# Patient Record
Sex: Male | Born: 1985 | Race: White | Hispanic: No | Marital: Married | Smoking: Former smoker
Health system: Southern US, Community
[De-identification: ages and names within clinical notes are randomized; demographics above are authoritative.]

## PROBLEM LIST (undated history)

## (undated) ENCOUNTER — Ambulatory Visit (HOSPITAL_COMMUNITY): Payer: No Typology Code available for payment source | Source: Home / Self Care

## (undated) ENCOUNTER — Ambulatory Visit: Payer: No Typology Code available for payment source

## (undated) DIAGNOSIS — J45909 Unspecified asthma, uncomplicated: Secondary | ICD-10-CM

## (undated) DIAGNOSIS — I10 Essential (primary) hypertension: Secondary | ICD-10-CM

## (undated) HISTORY — PX: APPENDECTOMY: SHX54

## (undated) HISTORY — PX: INNER EAR SURGERY: SHX679

---

## 2016-10-10 ENCOUNTER — Encounter: Payer: Self-pay | Admitting: Emergency Medicine

## 2016-10-10 ENCOUNTER — Emergency Department
Admission: EM | Admit: 2016-10-10 | Discharge: 2016-10-10 | Disposition: A | Discharge status: Home / Self Care | Payer: Medicaid - Out of State | Attending: Emergency Medicine | Admitting: Emergency Medicine

## 2016-10-10 DIAGNOSIS — J069 Acute upper respiratory infection, unspecified: Secondary | ICD-10-CM

## 2016-10-10 DIAGNOSIS — Y9301 Activity, walking, marching and hiking: Secondary | ICD-10-CM | POA: Diagnosis not present

## 2016-10-10 DIAGNOSIS — Y999 Unspecified external cause status: Secondary | ICD-10-CM | POA: Insufficient documentation

## 2016-10-10 DIAGNOSIS — S76211A Strain of adductor muscle, fascia and tendon of right thigh, initial encounter: Secondary | ICD-10-CM | POA: Diagnosis not present

## 2016-10-10 DIAGNOSIS — R0981 Nasal congestion: Secondary | ICD-10-CM | POA: Diagnosis present

## 2016-10-10 DIAGNOSIS — X509XXA Other and unspecified overexertion or strenuous movements or postures, initial encounter: Secondary | ICD-10-CM | POA: Insufficient documentation

## 2016-10-10 DIAGNOSIS — Y929 Unspecified place or not applicable: Secondary | ICD-10-CM | POA: Diagnosis not present

## 2016-10-10 MED ORDER — IBUPROFEN 600 MG PO TABS: 600.0000 mg | ORAL_TABLET | Freq: Three times a day (TID) | ORAL | 0 refills | Status: DC | PRN

## 2016-10-10 MED ORDER — PSEUDOEPH-BROMPHEN-DM 30-2-10 MG/5ML PO SYRP: 5.0000 mL | ORAL_SOLUTION | Freq: Four times a day (QID) | ORAL | 0 refills | Status: DC | PRN

## 2016-10-10 MED ORDER — TRAMADOL HCL 50 MG PO TABS: 50.0000 mg | ORAL_TABLET | Freq: Four times a day (QID) | ORAL | 0 refills | Status: DC | PRN

## 2016-10-10 NOTE — ED Notes (Signed)
See triage note  States he developed pain to right groin and leg on Saturday  Then some discomfort went up to chest and headache  Positive fever /chills but afebrile on arrival also has had dry cough ambulates well to treatment area.

## 2016-10-10 NOTE — ED Provider Notes (Signed)
[facility] Emergency Department Provider Note   ____________________________________________   First MD Initiated Contact with Patient 10/10/16 [phone]     (approximate)  I have reviewed the triage vital signs and the nursing notes.   HISTORY  Chief Complaint Flu like symptoms    HPI Julian Franklin is a 31 y.o. male patient complaining of right groin pain and URI signs or symptoms.Patient state right groin pain started after walking incident 2 days ago. Patient state URI signs and symptoms consisting of nasal and chest congestion started yesterday. Patient denies fever associated this complaint. Patient denies nausea, vomiting, or diarrhea. Patient described his groin pain as "achy". No palliative measures for complaint. Patient rates his pain as a 6/10.   History reviewed. No pertinent past medical history.  There are no active problems to display for this patient.   Past Surgical History:  Procedure Laterality Date  . APPENDECTOMY    . INNER EAR SURGERY      Prior to Admission medications   Medication Sig Start Date End Date Taking? Authorizing Provider  brompheniramine-pseudoephedrine-DM 30-2-10 MG/5ML syrup Take 5 mLs by mouth 4 (four) times daily as needed. 10/10/16   Joni ReiningSmith, Roselie Cirigliano K, PA-C  ibuprofen (ADVIL,MOTRIN) 600 MG tablet Take 1 tablet (600 mg total) by mouth every 8 (eight) hours as needed. 10/10/16   Joni ReiningSmith, Jaceion Aday K, PA-C  traMADol (ULTRAM) 50 MG tablet Take 1 tablet (50 mg total) by mouth every 6 (six) hours as needed for moderate pain. 10/10/16   Joni ReiningSmith, Mouna Yager K, PA-C    Allergies Ceclor [cefaclor]  No family history on file.  Social History Social History  Substance Use Topics  . Smoking status: Not on file  . Smokeless tobacco: Not on file  . Alcohol use Not on file    Review of Systems  Constitutional: No fever/chills Eyes: No visual changes. ENT: No sore throat. Cardiovascular: Denies chest pain. Respiratory:  Denies shortness of breath. Gastrointestinal: No abdominal pain.  No nausea, no vomiting.  No diarrhea.  No constipation. Genitourinary: Negative for dysuria. Musculoskeletal: Negative for back pain. Skin: Negative for rash. Neurological: Negative for headaches, focal weakness or numbness. Allergic/Immunilogical: See medication list ____________________________________________   PHYSICAL EXAM:  VITAL SIGNS: ED Triage Vitals  Enc Vitals Group     BP 10/10/16 0849 125/80     Pulse Rate 10/10/16 0849 87     Resp 10/10/16 0849 18     Temp 10/10/16 0849 98.5 F (36.9 C)     Temp Source 10/10/16 0849 Oral     SpO2 10/10/16 0849 99 %     Weight 10/10/16 0845 200 lb (90.7 kg)     Height 10/10/16 0845 5\' 5"  (1.651 m)     Head Circumference --      Peak Flow --      Pain Score 10/10/16 0843 6     Pain Loc --      Pain Edu? --      Excl. in GC? --     Constitutional: Alert and oriented. Well appearing and in no acute distress. Eyes: Conjunctivae are normal. PERRL. EOMI. Head: Atraumatic. Nose:Edematous nasal turbinates clear rhinorrhea. Bilateral maxillary guarding.  Mouth/Throat: Mucous membranes are moist.  Oropharynx non-erythematous. Postnasal drainage. Neck: No stridor.  Hematological/Lymphatic/Immunilogical: No cervical lymphadenopathy. Cardiovascular: Normal rate, regular rhythm. Grossly normal heart sounds.  Good peripheral circulation. Respiratory: Normal respiratory effort.  No retractions. Lungs CTAB. Gastrointestinal: Soft and nontender. No distention. No abdominal bruits. No CVA tenderness.  Musculoskeletal: No lower extremity tenderness nor edema.  No joint effusions. Moderate guarding with palpation the right inguinal area. No palpable masses. Neurologic:  Normal speech and language. No gross focal neurologic deficits are appreciated. No gait instability. Skin:  Skin is warm, dry and intact. No rash noted. Psychiatric: Mood and affect are normal. Speech and behavior are  normal.  ____________________________________________   LABS (all labs ordered are listed, but only abnormal results are displayed)  Labs Reviewed - No data to display ____________________________________________  EKG   ____________________________________________  RADIOLOGY  No results found.  ____________________________________________   PROCEDURES  Procedure(s) performed: None  Procedures  Critical Care performed: No  ____________________________________________   INITIAL IMPRESSION / ASSESSMENT AND PLAN / ED COURSE  Pertinent labs & imaging results that were available during my care of the patient were reviewed by me and considered in my medical decision making (see chart for details).  URI with right inguinal strain. Patient given discharge care instructions.      ____________________________________________   FINAL CLINICAL IMPRESSION(S) / ED DIAGNOSES  Final diagnoses:  Viral upper respiratory tract infection  Inguinal strain, right, initial encounter      NEW MEDICATIONS STARTED DURING THIS VISIT:  New Prescriptions   BROMPHENIRAMINE-PSEUDOEPHEDRINE-DM 30-2-10 MG/5ML SYRUP    Take 5 mLs by mouth 4 (four) times daily as needed.   IBUPROFEN (ADVIL,MOTRIN) 600 MG TABLET    Take 1 tablet (600 mg total) by mouth every 8 (eight) hours as needed.   TRAMADOL (ULTRAM) 50 MG TABLET    Take 1 tablet (50 mg total) by mouth every 6 (six) hours as needed for moderate pain.     Note:  This document was prepared using Dragon voice recognition software and may include unintentional dictation errors.    Joni Reining, PA-C 10/10/16 4098    Nita Sickle, MD 10/11/16 [phone]

## 2016-10-10 NOTE — ED Triage Notes (Signed)
Pt reports body aches, chills and nasal drainage. Denies cough. Denies vomiting or diarrhea. Pt reports symptoms began with leg pain and progressed. Pt reports symptoms began yesterday.

## 2016-10-11 ENCOUNTER — Encounter: Payer: Self-pay | Admitting: Emergency Medicine

## 2016-10-11 ENCOUNTER — Emergency Department
Admission: EM | Admit: 2016-10-11 | Discharge: 2016-10-11 | Disposition: A | Discharge status: Home / Self Care | Payer: Medicaid - Out of State | Attending: Emergency Medicine | Admitting: Emergency Medicine

## 2016-10-11 DIAGNOSIS — R21 Rash and other nonspecific skin eruption: Secondary | ICD-10-CM | POA: Insufficient documentation

## 2016-10-11 DIAGNOSIS — L259 Unspecified contact dermatitis, unspecified cause: Secondary | ICD-10-CM | POA: Diagnosis not present

## 2016-10-11 DIAGNOSIS — F17200 Nicotine dependence, unspecified, uncomplicated: Secondary | ICD-10-CM | POA: Diagnosis not present

## 2016-10-11 DIAGNOSIS — L309 Dermatitis, unspecified: Secondary | ICD-10-CM

## 2016-10-11 DIAGNOSIS — L299 Pruritus, unspecified: Secondary | ICD-10-CM | POA: Diagnosis present

## 2016-10-11 MED ORDER — HYDROXYZINE HCL 50 MG PO TABS
50.0000 mg | ORAL_TABLET | Freq: Once | ORAL | Status: AC
  Administered 2016-10-11: 50 mg via ORAL
  Filled 2016-10-11: qty 1

## 2016-10-11 MED ORDER — METHYLPREDNISOLONE 4 MG PO TBPK: ORAL_TABLET | ORAL | 0 refills | Status: DC

## 2016-10-11 MED ORDER — METHYLPREDNISOLONE SODIUM SUCC 125 MG IJ SOLR
125.0000 mg | Freq: Once | INTRAMUSCULAR | Status: AC
  Administered 2016-10-11: 125 mg via INTRAMUSCULAR
  Filled 2016-10-11: qty 2

## 2016-10-11 MED ORDER — HYDROXYZINE HCL 50 MG PO TABS: 50.0000 mg | ORAL_TABLET | Freq: Three times a day (TID) | ORAL | 0 refills | Status: DC | PRN

## 2016-10-11 NOTE — ED Notes (Signed)
See triage note states he was seen last week for right groin and upper leg pain    Then developed a red rash to right lower leg with some swelling

## 2016-10-11 NOTE — ED Triage Notes (Signed)
Pt to ed with c/o right leg rash.  Redness and mild swelling noted to right lower leg that started yesterday.

## 2016-10-11 NOTE — ED Provider Notes (Signed)
[facility] Emergency Department Provider Note   ____________________________________________   First MD Initiated Contact with Patient 10/11/16 [phone]     (approximate)  I have reviewed the triage vital signs and the nursing notes.   HISTORY  Chief Complaint Leg Pain    HPI Mancel ParsonsJonathan David Brisbane is a 31 y.o. male patient complaining of rash to right upper and lower leg which began yesterday. Patient stated mild redness ,swelling, and intense itching. Patient unsure etiology a rash. No palliative measures for complaint. Patient rates his pain as a 6/10 but he stated this secondary to inguinal strain.   History reviewed. No pertinent past medical history.  There are no active problems to display for this patient.   Past Surgical History:  Procedure Laterality Date  . APPENDECTOMY    . INNER EAR SURGERY      Prior to Admission medications   Medication Sig Start Date End Date Taking? Authorizing Provider  brompheniramine-pseudoephedrine-DM 30-2-10 MG/5ML syrup Take 5 mLs by mouth 4 (four) times daily as needed. 10/10/16   Joni ReiningSmith, Cardale Dorer K, PA-C  hydrOXYzine (ATARAX/VISTARIL) 50 MG tablet Take 1 tablet (50 mg total) by mouth 3 (three) times daily as needed for itching. 10/11/16   Joni ReiningSmith, Bianca Raneri K, PA-C  ibuprofen (ADVIL,MOTRIN) 600 MG tablet Take 1 tablet (600 mg total) by mouth every 8 (eight) hours as needed. 10/10/16   Joni ReiningSmith, Ammaar Encina K, PA-C  methylPREDNISolone (MEDROL DOSEPAK) 4 MG TBPK tablet Take Tapered dose as directed 10/11/16   Joni ReiningSmith, Resa Rinks K, PA-C  traMADol (ULTRAM) 50 MG tablet Take 1 tablet (50 mg total) by mouth every 6 (six) hours as needed for moderate pain. 10/10/16   Joni ReiningSmith, Demi Trieu K, PA-C    Allergies   History reviewed. No pertinent family history.  Social History Social History  Substance Use Topics  . Smoking status: Current Every Day Smoker  . Smokeless tobacco: Never Used  . Alcohol use No    Review of  Systems  Constitutional: No fever/chills Eyes: No visual changes. ENT: No sore throat. Cardiovascular: Denies chest pain. Respiratory: Denies shortness of breath. Gastrointestinal: No abdominal pain.  No nausea, no vomiting.  No diarrhea.  No constipation. Genitourinary: Negative for dysuria. Musculoskeletal: Negative for back pain. Skin:Positive for rash. Neurological: Negative for headaches, focal weakness or numbness.   ____________________________________________   PHYSICAL EXAM:  VITAL SIGNS: ED Triage Vitals [10/11/16 0816]  Enc Vitals Group     BP (!) 132/91     Pulse Rate (!) 108     Resp 16     Temp 98.7 F (37.1 C)     Temp Source Oral     SpO2 100 %     Weight      Height      Head Circumference      Peak Flow      Pain Score 6     Pain Loc      Pain Edu?      Excl. in GC?     Constitutional: Alert and oriented. Well appearing and in no acute distress. Eyes: Conjunctivae are normal. PERRL. EOMI. Cardiovascular: Normal rate, regular rhythm. Grossly normal heart sounds.  Good peripheral circulation. Respiratory: Normal respiratory effort.  No retractions. Lungs CTAB. Neurologic:  Normal speech and language. No gross focal neurologic deficits are appreciated. No gait instability. Skin:  Skin is warm, dry and intact. Vesicle lesions on erythematous base right leg.  Psychiatric: Mood and affect are normal. Speech and behavior are normal.  ____________________________________________   LABS (all labs ordered are listed, but only abnormal results are displayed)  Labs Reviewed - No data to display ____________________________________________  EKG   ____________________________________________  RADIOLOGY  No results found.  ____________________________________________   PROCEDURES  Procedure(s) performed: None  Procedures  Critical Care performed: No  ____________________________________________   INITIAL IMPRESSION / ASSESSMENT AND PLAN  / ED COURSE  Pertinent labs & imaging results that were available during my care of the patient were reviewed by me and considered in my medical decision making (see chart for details).  Contact dermatitis etiology unknown. Discussed treatment therapy with patient. Patient given discharge care instructions. Patient advised [facility] if no improvement in 3-5 days.      ____________________________________________   FINAL CLINICAL IMPRESSION(S) / ED DIAGNOSES  Final diagnoses:  Dermatitis  Rash and nonspecific skin eruption      NEW MEDICATIONS STARTED DURING THIS VISIT:  New Prescriptions   HYDROXYZINE (ATARAX/VISTARIL) 50 MG TABLET    Take 1 tablet (50 mg total) by mouth 3 (three) times daily as needed for itching.   METHYLPREDNISOLONE (MEDROL DOSEPAK) 4 MG TBPK TABLET    Take Tapered dose as directed     Note:  This document was prepared using Dragon voice recognition software and may include unintentional dictation errors.    Joni Reining, PA-C 10/11/16 0850    Rockne Menghini, MD 10/11/16 [phone]

## 2016-10-14 ENCOUNTER — Emergency Department
Admission: EM | Admit: 2016-10-14 | Discharge: 2016-10-14 | Disposition: A | Discharge status: Home / Self Care | Payer: Medicaid - Out of State | Attending: Emergency Medicine | Admitting: Emergency Medicine

## 2016-10-14 ENCOUNTER — Encounter: Payer: Self-pay | Admitting: *Deleted

## 2016-10-14 ENCOUNTER — Emergency Department: Payer: Medicaid - Out of State

## 2016-10-14 DIAGNOSIS — R2241 Localized swelling, mass and lump, right lower limb: Secondary | ICD-10-CM | POA: Diagnosis present

## 2016-10-14 DIAGNOSIS — F17200 Nicotine dependence, unspecified, uncomplicated: Secondary | ICD-10-CM | POA: Insufficient documentation

## 2016-10-14 DIAGNOSIS — L03115 Cellulitis of right lower limb: Secondary | ICD-10-CM | POA: Insufficient documentation

## 2016-10-14 LAB — CBC WITH DIFFERENTIAL/PLATELET
BASOS ABS: 0.2 10*3/uL — AB (ref 0–0.1)
BASOS PCT: 1 %
Eosinophils Absolute: 0.1 10*3/uL (ref 0–0.7)
Eosinophils Relative: 0 %
HEMATOCRIT: 38.3 % — AB (ref 40.0–52.0)
Hemoglobin: 13 g/dL (ref 13.0–18.0)
LYMPHS PCT: 18 %
Lymphs Abs: 4.1 10*3/uL — ABNORMAL HIGH (ref 1.0–3.6)
MCH: 27.2 pg (ref 26.0–34.0)
MCHC: 34 g/dL (ref 32.0–36.0)
MCV: 80 fL (ref 80.0–100.0)
Monocytes Absolute: 1.5 10*3/uL — ABNORMAL HIGH (ref 0.2–1.0)
Monocytes Relative: 7 %
NEUTROS ABS: 16.6 10*3/uL — AB (ref 1.4–6.5)
Neutrophils Relative %: 74 %
PLATELETS: 308 10*3/uL (ref 150–440)
RBC: 4.79 MIL/uL (ref 4.40–5.90)
RDW: 13.9 % (ref 11.5–14.5)
WBC: 22.4 10*3/uL — AB (ref 3.8–10.6)

## 2016-10-14 LAB — COMPREHENSIVE METABOLIC PANEL
ALBUMIN: 4 g/dL (ref 3.5–5.0)
ALK PHOS: 77 U/L (ref 38–126)
ALT: 28 U/L (ref 17–63)
ANION GAP: 5 (ref 5–15)
AST: 27 U/L (ref 15–41)
BILIRUBIN TOTAL: 0.5 mg/dL (ref 0.3–1.2)
BUN: 13 mg/dL (ref 6–20)
CALCIUM: 8.5 mg/dL — AB (ref 8.9–10.3)
CO2: 24 mmol/L (ref 22–32)
Chloride: 106 mmol/L (ref 101–111)
Creatinine, Ser: 0.82 mg/dL (ref 0.61–1.24)
Glucose, Bld: 126 mg/dL — ABNORMAL HIGH (ref 65–99)
POTASSIUM: 3.5 mmol/L (ref 3.5–5.1)
Sodium: 135 mmol/L (ref 135–145)
TOTAL PROTEIN: 7.4 g/dL (ref 6.5–8.1)

## 2016-10-14 MED ORDER — DOXYCYCLINE HYCLATE 100 MG PO TABS
100.0000 mg | ORAL_TABLET | Freq: Once | ORAL | Status: AC
  Administered 2016-10-14: 100 mg via ORAL
  Filled 2016-10-14: qty 1

## 2016-10-14 MED ORDER — DOXYCYCLINE HYCLATE 100 MG PO CAPS: 100.0000 mg | ORAL_CAPSULE | Freq: Two times a day (BID) | ORAL | 0 refills | Status: DC

## 2016-10-14 NOTE — ED Triage Notes (Signed)
States he was seen in ED Monday and Tuesday for right leg pain, states since Tuesday he has developed increased redness and pain in his right leg, large red area to inner right shin that goes up his leg, ambulatory

## 2016-10-14 NOTE — ED Notes (Signed)
Pt in US at this time 

## 2016-10-14 NOTE — ED Notes (Signed)
Pt returned from US

## 2016-10-14 NOTE — ED Provider Notes (Signed)
[facility] Emergency Department Provider Note  ____________________________________________   First MD Initiated Contact with Patient 10/14/16 2023     (approximate)  I have reviewed the triage vital signs and the nursing notes.   HISTORY  Chief Complaint Leg Pain and Rash   HPI Julian Franklin is a 31 y.o. male without any chronic medical conditions was presenting to the emergency department today with 1 week of right lower extremity rash and now swelling. He initially presented on July 16 to this emergency department after having URI symptoms with right groin pain. He says that the right groin pain has now subsided but that he has had an expanding rash to his right lower extremity especially to the medial aspect. He returned to the emergency department on the 17th and was diagnosed with a contact dermatitis because he reported that the rash was itchy. At that point he was prescribed hydroxyzine as well as a Medrol Dosepak. However, since taking these medications as symptoms have progressed. He says that the swelling started yesterday and has affected with his foot and his right lower extremity. Denies any history of blood clots does say that he smokes. Says that in addition to the itching and is also painful.   History reviewed. No pertinent past medical history.  There are no active problems to display for this patient.   Past Surgical History:  Procedure Laterality Date  . APPENDECTOMY    . INNER EAR SURGERY      Prior to Admission medications   Medication Sig Start Date End Date Taking? Authorizing Provider  brompheniramine-pseudoephedrine-DM 30-2-10 MG/5ML syrup Take 5 mLs by mouth 4 (four) times daily as needed. 10/10/16  Yes Joni Reining, PA-C  hydrOXYzine (ATARAX/VISTARIL) 50 MG tablet Take 1 tablet (50 mg total) by mouth 3 (three) times daily as needed for itching. 10/11/16  Yes Joni Reining, PA-C  ibuprofen (ADVIL,MOTRIN) 600 MG  tablet Take 1 tablet (600 mg total) by mouth every 8 (eight) hours as needed. 10/10/16  Yes Joni Reining, PA-C  methylPREDNISolone (MEDROL DOSEPAK) 4 MG TBPK tablet Take Tapered dose as directed 10/11/16  Yes Joni Reining, PA-C  traMADol (ULTRAM) 50 MG tablet Take 1 tablet (50 mg total) by mouth every 6 (six) hours as needed for moderate pain. 10/10/16  Yes Joni Reining, PA-C    Allergies Ceclor [cefaclor]  History reviewed. No pertinent family history.  Social History Social History  Substance Use Topics  . Smoking status: Current Every Day Smoker  . Smokeless tobacco: Never Used  . Alcohol use No    Review of Systems  Constitutional: chills Eyes: No visual changes. ENT: No sore throat. Cardiovascular: Denies chest pain. Respiratory: Denies shortness of breath. Gastrointestinal: No abdominal pain.  No nausea, no vomiting.  No diarrhea.  No constipation. Genitourinary: Negative for dysuria. Musculoskeletal: Negative for back pain. Skin: Negative for rash. Neurological: Negative for headaches, focal weakness or numbness.   ____________________________________________   PHYSICAL EXAM:  VITAL SIGNS: ED Triage Vitals  Enc Vitals Group     BP 10/14/16 1641 135/86     Pulse Rate 10/14/16 1641 80     Resp 10/14/16 1641 16     Temp 10/14/16 1641 99.1 F (37.3 C)     Temp Source 10/14/16 1641 Oral     SpO2 10/14/16 1641 97 %     Weight --      Height --      Head Circumference --  Peak Flow --      Pain Score 10/14/16 1636 4     Pain Loc --      Pain Edu? --      Excl. in GC? --     Constitutional: Alert and oriented. Well appearing and in no acute distress. Eyes: Conjunctivae are normal.  Head: Atraumatic. Nose: No congestion/rhinnorhea. Mouth/Throat: Mucous membranes are moist.  Neck: No stridor.   Cardiovascular: Normal rate, regular rhythm. Grossly normal heart sounds.  Good peripheral circulation. Respiratory: Normal respiratory effort.  No  retractions. Lungs CTAB. Gastrointestinal: Soft and nontender. No distention. No CVA tenderness. Musculoskeletal: Right leg extremity with erythema laterally as well as medially but mostly sparing the posterior aspect of the calf. Medially there is a patch of erythema that is not warm to touch that is approximately 10 cm and circular. It is tender to palpation without any induration. Laterally there is erythema which is also non-blanchable and without any induration or warmth. This patch covers the majority of the lateral aspect of the calf. There is also edema to the right lower extremity from the foot extending just distal to the right knee. Dorsalis pedis pulses are present and equal bilaterally. Neurologic:  Normal speech and language. No gross focal neurologic deficits are appreciated. Skin:  Skin is warm, dry and intact. No rash noted. Psychiatric: Mood and affect are normal. Speech and behavior are normal.  ____________________________________________   LABS (all labs ordered are listed, but only abnormal results are displayed)  Labs Reviewed  COMPREHENSIVE METABOLIC PANEL - Abnormal; Notable for the following:       Result Value   Glucose, Bld 126 (*)    Calcium 8.5 (*)    All other components within normal limits  CBC WITH DIFFERENTIAL/PLATELET - Abnormal; Notable for the following:    WBC 22.4 (*)    HCT 38.3 (*)    Neutro Abs 16.6 (*)    Lymphs Abs 4.1 (*)    Monocytes Absolute 1.5 (*)    Basophils Absolute 0.2 (*)    All other components within normal limits   ____________________________________________  EKG   ____________________________________________  RADIOLOGY  No evidence of DVT however there were internal nodes noted. ____________________________________________   PROCEDURES  Procedure(s) performed:   Procedures  Critical Care performed:   ____________________________________________   INITIAL IMPRESSION / ASSESSMENT AND PLAN / ED  COURSE  Pertinent labs & imaging results that were available during my care of the patient were reviewed by me and considered in my medical decision making (see chart for details).  ----------------------------------------- 11:12 PM on 10/14/2016 -----------------------------------------  Patient likely with cellulitis. Increased white blood cell count as well as reactive lymph nodes in the groin. Patient would like to be discharged home. I offered him IV antibiotics as well as observation but he says that he would rather go home and go to work. He is nontoxic in appearance with reassuring vital signs. We had a strict return precautions discussion that he would come back immediately for any worsening or concerning symptoms including fever or any worsening of the rash or swelling or pain. He is understanding of the plan and willing to comply. I reexamined his leg and there is no further spread of the rash. No worsening of the edema. The pain is not out of proportion to the exam. There is no crepitus. Patient denies any recent exposure to brackish or salt water.      ____________________________________________   FINAL CLINICAL IMPRESSION(S) / ED DIAGNOSES  Lower extremity cellulitis.    NEW MEDICATIONS STARTED DURING THIS VISIT:  New Prescriptions   No medications on file     Note:  This document was prepared using Dragon voice recognition software and may include unintentional dictation errors.     Myrna BlazerSchaevitz, Dorcus Riga Matthew, MD 10/14/16 [phone]

## 2017-01-02 ENCOUNTER — Encounter: Payer: Self-pay | Admitting: Emergency Medicine

## 2017-01-02 DIAGNOSIS — L03012 Cellulitis of left finger: Secondary | ICD-10-CM | POA: Insufficient documentation

## 2017-01-02 DIAGNOSIS — Y939 Activity, unspecified: Secondary | ICD-10-CM | POA: Insufficient documentation

## 2017-01-02 DIAGNOSIS — Z79899 Other long term (current) drug therapy: Secondary | ICD-10-CM | POA: Insufficient documentation

## 2017-01-02 DIAGNOSIS — F17200 Nicotine dependence, unspecified, uncomplicated: Secondary | ICD-10-CM | POA: Insufficient documentation

## 2017-01-02 DIAGNOSIS — Y929 Unspecified place or not applicable: Secondary | ICD-10-CM | POA: Insufficient documentation

## 2017-01-02 DIAGNOSIS — Y999 Unspecified external cause status: Secondary | ICD-10-CM | POA: Insufficient documentation

## 2017-01-02 DIAGNOSIS — W57XXXA Bitten or stung by nonvenomous insect and other nonvenomous arthropods, initial encounter: Secondary | ICD-10-CM | POA: Insufficient documentation

## 2017-01-02 NOTE — ED Triage Notes (Signed)
Patient ambulatory to triage with steady gait, without difficulty or distress noted; pt reports ?spider bite to left 3rd finger

## 2017-01-03 ENCOUNTER — Encounter: Payer: Self-pay | Admitting: Physician Assistant

## 2017-01-03 ENCOUNTER — Emergency Department
Admission: EM | Admit: 2017-01-03 | Discharge: 2017-01-03 | Disposition: A | Discharge status: Home / Self Care | Payer: Medicaid - Out of State | Attending: Emergency Medicine | Admitting: Emergency Medicine

## 2017-01-03 DIAGNOSIS — L03012 Cellulitis of left finger: Secondary | ICD-10-CM

## 2017-01-03 DIAGNOSIS — W57XXXA Bitten or stung by nonvenomous insect and other nonvenomous arthropods, initial encounter: Secondary | ICD-10-CM

## 2017-01-03 MED ORDER — BACITRACIN ZINC 500 UNIT/GM EX OINT
TOPICAL_OINTMENT | Freq: Once | CUTANEOUS | Status: AC
  Administered 2017-01-03: 02:00:00 via TOPICAL
  Filled 2017-01-03: qty 0.9

## 2017-01-03 MED ORDER — SULFAMETHOXAZOLE-TRIMETHOPRIM 800-160 MG PO TABS: 1.0000 | ORAL_TABLET | Freq: Two times a day (BID) | ORAL | 0 refills | Status: DC

## 2017-01-03 MED ORDER — SULFAMETHOXAZOLE-TRIMETHOPRIM 800-160 MG PO TABS
1.0000 | ORAL_TABLET | Freq: Once | ORAL | Status: AC
  Administered 2017-01-03: 1 via ORAL
  Filled 2017-01-03: qty 1

## 2017-01-03 NOTE — ED Provider Notes (Signed)
[facility] Emergency Department Provider Note ____________________________________________  Time seen: 0039  I have reviewed the triage vital signs and the nursing notes.  HISTORY  Chief Complaint  Insect Bite  HPI Julian Franklin is a 31 y.o. male presents to the ED for evaluation and management of a presumed spider bite. He noted tenderness and redness to the left middle finger while at work on Saturday. He is unclear if an insect bite occurred, but notes redness, tenderness, and a single pointing lesion. He denies fevers, chills, swelling or spontaneous drainage.  History reviewed. No pertinent past medical history.  There are no active problems to display for this patient.   Past Surgical History:  Procedure Laterality Date  . APPENDECTOMY    . INNER EAR SURGERY      Prior to Admission medications   Medication Sig Start Date End Date Taking? Authorizing Provider  brompheniramine-pseudoephedrine-DM 30-2-10 MG/5ML syrup Take 5 mLs by mouth 4 (four) times daily as needed. 10/10/16   Joni Reining, PA-C  doxycycline (VIBRAMYCIN) 100 MG capsule Take 1 capsule (100 mg total) by mouth 2 (two) times daily. 10/14/16   Myrna Blazer, MD  hydrOXYzine (ATARAX/VISTARIL) 50 MG tablet Take 1 tablet (50 mg total) by mouth 3 (three) times daily as needed for itching. 10/11/16   Joni Reining, PA-C  ibuprofen (ADVIL,MOTRIN) 600 MG tablet Take 1 tablet (600 mg total) by mouth every 8 (eight) hours as needed. 10/10/16   Joni Reining, PA-C  methylPREDNISolone (MEDROL DOSEPAK) 4 MG TBPK tablet Take Tapered dose as directed 10/11/16   Joni Reining, PA-C  sulfamethoxazole-trimethoprim (BACTRIM DS,SEPTRA DS) 800-160 MG tablet Take 1 tablet by mouth 2 (two) times daily. 01/03/17   Arhan Mcmanamon, Charlesetta Ivory, PA-C  traMADol (ULTRAM) 50 MG tablet Take 1 tablet (50 mg total) by mouth every 6 (six) hours as needed for moderate pain. 10/10/16   Joni Reining, PA-C    Allergies Ceclor [cefaclor]  No family history on file.  Social History Social History  Substance Use Topics  . Smoking status: Current Every Day Smoker  . Smokeless tobacco: Never Used  . Alcohol use No    Review of Systems  Constitutional: Negative for fever. Cardiovascular: Negative for chest pain. Respiratory: Negative for shortness of breath. Musculoskeletal: Negative for back pain. Skin: Negative for rash. Finger infection as above.  Neurological: Negative for headaches, focal weakness or numbness. ____________________________________________  PHYSICAL EXAM:  VITAL SIGNS: ED Triage Vitals [01/02/17 2305]  Enc Vitals Group     BP 139/84     Pulse Rate 83     Resp 18     Temp 98.8 F (37.1 C)     Temp src      SpO2 98 %     Weight 210 lb (95.3 kg)     Height  (1.651 m)     Head Circumference      Peak Flow      Pain Score 7     Pain Loc      Pain Edu?      Excl. in GC?     Constitutional: Alert and oriented. Well appearing and in no distress. Head: Normocephalic and atraumatic. Hematological/Lymphatic/Immunological: No cervical lymphadenopathy. Cardiovascular: Normal rate, regular rhythm. Normal distal pulses. Respiratory: Normal respiratory effort.  Musculoskeletal: Normal composite fist. Left ring finger with a single, 1 cm diameter, well-demarcated, erythematous macule with central pustule. Nontender with normal range of motion in all extremities.  Neurologic:  Normal gait without ataxia. Normal speech and language. No gross focal neurologic deficits are appreciated. Skin:  Skin is warm, dry and intact. No rash noted. ____________________________________________  PROCEDURES  Bactrim DS 1 PO  INCISION AND DRAINAGE Performed by: Lissa Hoard Consent: Verbal consent obtained. Risks and benefits: risks, benefits and alternatives were discussed Type: abscess  Body area: left middle finger  Anesthesia: none  Incision was made  with a 18 g needle.  Complexity: simple   Blunt dissection to break up loculations  Drainage: serosanguinous   Drainage amount: minimal  Dressing material: dry sterile dressing  Patient tolerance: Patient tolerated the procedure well with no immediate complications. ____________________________________________  INITIAL IMPRESSION / ASSESSMENT AND PLAN / ED COURSE  Patient with ED evaluation of a left middle finger cellulitis. The superficial lesion is I&D'd in the ED with minimal serosanguinous drainage. The wound is appropriately dressed, and a prescription for Bactrim is provided. Wound care instructions are provided. Return to a local community clinic or the ED as needed for wound check ____________________________________________  FINAL CLINICAL IMPRESSION(S) / ED DIAGNOSES  Final diagnoses:  Insect bite, initial encounter  Cellulitis of finger of left hand     Karmen Stabs, Charlesetta Ivory, PA-C 01/03/17 0102    Jene Every, MD 01/03/17 [phone]

## 2017-01-03 NOTE — ED Notes (Signed)
Pt assessed by Ronald Lobo, PA in triage; wound clensed and dressed by PA

## 2017-01-03 NOTE — Discharge Instructions (Signed)
Take the antibiotic as directed. Keep the wound clean, dry, and covered. Soak the wound in warm water + Epsom salts to promote healing. Follow-up with [facility] or return to the ED as needed.

## 2017-01-11 ENCOUNTER — Emergency Department
Admission: EM | Admit: 2017-01-11 | Discharge: 2017-01-11 | Disposition: A | Discharge status: Home / Self Care | Payer: Self-pay | Attending: Emergency Medicine | Admitting: Emergency Medicine

## 2017-01-11 ENCOUNTER — Encounter: Payer: Self-pay | Admitting: Emergency Medicine

## 2017-01-11 DIAGNOSIS — S60562D Insect bite (nonvenomous) of left hand, subsequent encounter: Secondary | ICD-10-CM | POA: Insufficient documentation

## 2017-01-11 DIAGNOSIS — F17200 Nicotine dependence, unspecified, uncomplicated: Secondary | ICD-10-CM | POA: Insufficient documentation

## 2017-01-11 DIAGNOSIS — W57XXXD Bitten or stung by nonvenomous insect and other nonvenomous arthropods, subsequent encounter: Secondary | ICD-10-CM | POA: Insufficient documentation

## 2017-01-11 MED ORDER — CLINDAMYCIN HCL 150 MG PO CAPS: 150.0000 mg | ORAL_CAPSULE | Freq: Four times a day (QID) | ORAL | 0 refills | Status: DC

## 2017-01-11 MED ORDER — IBUPROFEN 600 MG PO TABS: 600.0000 mg | ORAL_TABLET | Freq: Three times a day (TID) | ORAL | 0 refills | Status: DC | PRN

## 2017-01-11 MED ORDER — TRAMADOL HCL 50 MG PO TABS
50.0000 mg | ORAL_TABLET | Freq: Two times a day (BID) | ORAL | 0 refills | Status: DC | PRN
Start: 2017-01-11 — End: 2017-06-20

## 2017-01-11 NOTE — ED Triage Notes (Signed)
Possible spider bite to left hand.

## 2017-01-11 NOTE — Discharge Instructions (Signed)
Advised Epsom salts soak twice a day. 5-10 minutes.

## 2017-01-11 NOTE — ED Provider Notes (Signed)
[facility] Emergency Department Provider Note   ____________________________________________   First MD Initiated Contact with Patient 01/11/17 [phone]     (approximate)  I have reviewed the triage vital signs and the nursing notes.   HISTORY  Chief Complaint Insect Bite    HPI Julian Franklin is a 31 y.o. male patient complaining of edema and erythema to third digit left hand.atient seen 01/03/2017 at this facility noticed a little complaint. Patient was given Bactrim DS which has not resolved his complaint. Patient state increased edema and erythema since his last visit. Patient rates pain as a 5/10. No other palliative measures for complaint.  History reviewed. No pertinent past medical history.  There are no active problems to display for this patient.   Past Surgical History:  Procedure Laterality Date  . APPENDECTOMY    . INNER EAR SURGERY      Prior to Admission medications   Medication Sig Start Date End Date Taking? Authorizing Provider  brompheniramine-pseudoephedrine-DM 30-2-10 MG/5ML syrup Take 5 mLs by mouth 4 (four) times daily as needed. 10/10/16   Joni ReiningSmith, Keir Foland K, PA-C  clindamycin (CLEOCIN) 150 MG capsule Take 1 capsule (150 mg total) by mouth 4 (four) times daily. 01/11/17   Joni ReiningSmith, Salene Mohamud K, PA-C  doxycycline (VIBRAMYCIN) 100 MG capsule Take 1 capsule (100 mg total) by mouth 2 (two) times daily. 10/14/16   Myrna BlazerSchaevitz, David Matthew, MD  hydrOXYzine (ATARAX/VISTARIL) 50 MG tablet Take 1 tablet (50 mg total) by mouth 3 (three) times daily as needed for itching. 10/11/16   Joni ReiningSmith, Trask Vosler K, PA-C  ibuprofen (ADVIL,MOTRIN) 600 MG tablet Take 1 tablet (600 mg total) by mouth every 8 (eight) hours as needed. 10/10/16   Joni ReiningSmith, Athziri Freundlich K, PA-C  ibuprofen (ADVIL,MOTRIN) 600 MG tablet Take 1 tablet (600 mg total) by mouth every 8 (eight) hours as needed. 01/11/17   Joni ReiningSmith, Kasch Borquez K, PA-C  methylPREDNISolone (MEDROL DOSEPAK) 4 MG TBPK tablet Take  Tapered dose as directed 10/11/16   Joni ReiningSmith, Jalayna Josten K, PA-C  sulfamethoxazole-trimethoprim (BACTRIM DS,SEPTRA DS) 800-160 MG tablet Take 1 tablet by mouth 2 (two) times daily. 01/03/17   Menshew, Charlesetta IvoryJenise V Bacon, PA-C  traMADol (ULTRAM) 50 MG tablet Take 1 tablet (50 mg total) by mouth every 6 (six) hours as needed for moderate pain. 10/10/16   Joni ReiningSmith, Shilah Hefel K, PA-C  traMADol (ULTRAM) 50 MG tablet Take 1 tablet (50 mg total) by mouth every 12 (twelve) hours as needed. 01/11/17   Joni ReiningSmith, Elian Gloster K, PA-C    Allergies Ceclor [cefaclor]  No family history on file.  Social History Social History  Substance Use Topics  . Smoking status: Current Every Day Smoker  . Smokeless tobacco: Never Used  . Alcohol use No    Review of Systems Constitutional: No fever/chills Eyes: No visual changes. ENT: No sore throat. Cardiovascular: Denies chest pain. Respiratory: Denies shortness of breath. Gastrointestinal: No abdominal pain.  No nausea, no vomiting.  No diarrhea.  No constipation. Genitourinary: Negative for dysuria. Musculoskeletal: Negative for back pain. Skin: Negative for rash. Redness swelling third digit left hand  Neurological: Negative for headaches, focal weakness or numbness. Allergic/Immunilogical: Ceclor ____________________________________________   PHYSICAL EXAM:  VITAL SIGNS: ED Triage Vitals  Enc Vitals Group     BP 01/11/17 0835 121/85     Pulse Rate 01/11/17 0835 68     Resp 01/11/17 0835 18     Temp 01/11/17 0835 98.5 F (36.9 C)     Temp Source 01/11/17 0835 Oral  SpO2 01/11/17 0835 98 %     Weight 01/11/17 0832 210 lb (95.3 kg)     Height --      Head Circumference --      Peak Flow --      Pain Score 01/11/17 0832 5     Pain Loc --      Pain Edu? --      Excl. in GC? --     Constitutional: Alert and oriented. Well appearing and in no acute distress. Cardiovascular: Normal rate, regular rhythm. Grossly normal heart sounds.  Good peripheral  circulation. Respiratory: Normal respiratory effort.  No retractions. Lungs CTAB. Gastrointestinal: Soft and nontender. No distention. No abdominal bruits. No CVA tenderness. Musculoskeletal: No lower extremity tenderness nor edema.  No joint effusions. Neurologic:  Normal speech and language. No gross focal neurologic deficits are appreciated. No gait instability. Skin:  Edema and erythema third digit left hand.Marland Kitchen Psychiatric: Mood and affect are normal. Speech and behavior are normal.  ____________________________________________   LABS (all labs ordered are listed, but only abnormal results are displayed)  Labs Reviewed - No data to display ____________________________________________  EKG   ____________________________________________  RADIOLOGY  No results found.  ____________________________________________   PROCEDURES  Procedure(s) performed: None  Procedures  Critical Care performed: No  ____________________________________________   INITIAL IMPRESSION / ASSESSMENT AND PLAN / ED COURSE  As part of my medical decision making, I reviewed the following data within the electronic MEDICAL RECORD NUMBER    Pain edema and third digit left hand secondary to infection. Patient refractory to Bactrim. Patient given discharge care instructions. Patient advised take clindamycin as directed.      ____________________________________________   FINAL CLINICAL IMPRESSION(S) / ED DIAGNOSES  Final diagnoses:  Bug bite with infection, subsequent encounter      NEW MEDICATIONS STARTED DURING THIS VISIT:  New Prescriptions   CLINDAMYCIN (CLEOCIN) 150 MG CAPSULE    Take 1 capsule (150 mg total) by mouth 4 (four) times daily.   IBUPROFEN (ADVIL,MOTRIN) 600 MG TABLET    Take 1 tablet (600 mg total) by mouth every 8 (eight) hours as needed.   TRAMADOL (ULTRAM) 50 MG TABLET    Take 1 tablet (50 mg total) by mouth every 12 (twelve) hours as needed.     Note:  This  document was prepared using Dragon voice recognition software and may include unintentional dictation errors.    Joni Reining, PA-C 01/11/17 9604    Rockne Menghini, MD 01/11/17 [phone]

## 2017-01-23 ENCOUNTER — Emergency Department
Admission: EM | Admit: 2017-01-23 | Discharge: 2017-01-23 | Disposition: A | Discharge status: Home / Self Care | Payer: Self-pay | Attending: Emergency Medicine | Admitting: Emergency Medicine

## 2017-01-23 DIAGNOSIS — F17200 Nicotine dependence, unspecified, uncomplicated: Secondary | ICD-10-CM | POA: Insufficient documentation

## 2017-01-23 DIAGNOSIS — H1032 Unspecified acute conjunctivitis, left eye: Secondary | ICD-10-CM | POA: Insufficient documentation

## 2017-01-23 DIAGNOSIS — Z79899 Other long term (current) drug therapy: Secondary | ICD-10-CM | POA: Insufficient documentation

## 2017-01-23 MED ORDER — KETOROLAC TROMETHAMINE 0.5 % OP SOLN: 1.0000 [drp] | Freq: Four times a day (QID) | OPHTHALMIC | 0 refills | Status: DC

## 2017-01-23 MED ORDER — KETOROLAC TROMETHAMINE 0.5 % OP SOLN
1.0000 [drp] | Freq: Once | OPHTHALMIC | Status: AC
  Administered 2017-01-23: 1 [drp] via OPHTHALMIC
  Filled 2017-01-23: qty 3

## 2017-01-23 MED ORDER — CIPROFLOXACIN HCL 0.3 % OP SOLN: 1.0000 [drp] | OPHTHALMIC | 1 refills | Status: AC

## 2017-01-23 MED ORDER — CIPROFLOXACIN HCL 0.3 % OP SOLN
2.0000 [drp] | Freq: Once | OPHTHALMIC | Status: AC
  Administered 2017-01-23: 2 [drp] via OPHTHALMIC
  Filled 2017-01-23: qty 2.5

## 2017-01-23 NOTE — ED Notes (Signed)
eyepatch applied to left eye with ace wrap

## 2017-01-23 NOTE — ED Provider Notes (Signed)
[facility] Emergency Department Provider Note  ____________________________________________  Time seen: Approximately 9:27 PM  I have reviewed the triage vital signs and the nursing notes.   HISTORY  Chief Complaint Eye Problem    HPI Julian ParsonsJonathan David Franklin is a 31 y.o. male who presents emergency department complaining of left eye irritation, pain, drainage.  Patient's son was diagnosed with bacterial conjunctivitis and now the patient and the rest of the family have symptoms.  Patient denies any visual changes.  He does not wear contacts.  No other complaints at this time.  No past medical history on file.  There are no active problems to display for this patient.   Past Surgical History:  Procedure Laterality Date  . APPENDECTOMY    . INNER EAR SURGERY      Prior to Admission medications   Medication Sig Start Date End Date Taking? Authorizing Provider  brompheniramine-pseudoephedrine-DM 30-2-10 MG/5ML syrup Take 5 mLs by mouth 4 (four) times daily as needed. 10/10/16   Joni ReiningSmith, Ronald K, PA-C  ciprofloxacin (CILOXAN) 0.3 % ophthalmic solution Place 1 drop into the left eye every 2 (two) hours. Administer 1 drop, every 2 hours, while awake, for 2 days. Then 1 drop, every 4 hours, while awake, for the next 5 days. 01/23/17 01/28/17  Cuthriell, Delorise RoyalsJonathan D, PA-C  clindamycin (CLEOCIN) 150 MG capsule Take 1 capsule (150 mg total) by mouth 4 (four) times daily. 01/11/17   Joni ReiningSmith, Ronald K, PA-C  doxycycline (VIBRAMYCIN) 100 MG capsule Take 1 capsule (100 mg total) by mouth 2 (two) times daily. 10/14/16   Myrna BlazerSchaevitz, David Matthew, MD  hydrOXYzine (ATARAX/VISTARIL) 50 MG tablet Take 1 tablet (50 mg total) by mouth 3 (three) times daily as needed for itching. 10/11/16   Joni ReiningSmith, Ronald K, PA-C  ibuprofen (ADVIL,MOTRIN) 600 MG tablet Take 1 tablet (600 mg total) by mouth every 8 (eight) hours as needed. 10/10/16   Joni ReiningSmith, Ronald K, PA-C  ibuprofen (ADVIL,MOTRIN) 600 MG  tablet Take 1 tablet (600 mg total) by mouth every 8 (eight) hours as needed. 01/11/17   Joni ReiningSmith, Ronald K, PA-C  ketorolac (ACULAR) 0.5 % ophthalmic solution Place 1 drop into the right eye 4 (four) times daily. 01/23/17   Cuthriell, Delorise RoyalsJonathan D, PA-C  methylPREDNISolone (MEDROL DOSEPAK) 4 MG TBPK tablet Take Tapered dose as directed 10/11/16   Joni ReiningSmith, Ronald K, PA-C  sulfamethoxazole-trimethoprim (BACTRIM DS,SEPTRA DS) 800-160 MG tablet Take 1 tablet by mouth 2 (two) times daily. 01/03/17   Menshew, Charlesetta IvoryJenise V Bacon, PA-C  traMADol (ULTRAM) 50 MG tablet Take 1 tablet (50 mg total) by mouth every 6 (six) hours as needed for moderate pain. 10/10/16   Joni ReiningSmith, Ronald K, PA-C  traMADol (ULTRAM) 50 MG tablet Take 1 tablet (50 mg total) by mouth every 12 (twelve) hours as needed. 01/11/17   Joni ReiningSmith, Ronald K, PA-C    Allergies Ceclor [cefaclor]  No family history on file.  Social History Social History  Substance Use Topics  . Smoking status: Current Every Day Smoker  . Smokeless tobacco: Never Used  . Alcohol use No     Review of Systems  Constitutional: No fever/chills Eyes: No visual changes. No discharge ENT: Positive for left eye irritation, redness, discharge Cardiovascular: no chest pain. Respiratory: no cough. No SOB. Gastrointestinal: No abdominal pain.  No nausea, no vomiting.   Musculoskeletal: Negative for musculoskeletal pain. Skin: Negative for rash, abrasions, lacerations, ecchymosis. Neurological: Negative for headaches, focal weakness or numbness. 10-point ROS otherwise negative.  ____________________________________________  PHYSICAL EXAM:  VITAL SIGNS: ED Triage Vitals [01/23/17 1926]  Enc Vitals Group     BP 138/87     Pulse Rate 85     Resp 18     Temp 98.5 F (36.9 C)     Temp Source Oral     SpO2 98 %     Weight      Height      Head Circumference      Peak Flow      Pain Score      Pain Loc      Pain Edu?      Excl. in GC?      Constitutional: Alert  and oriented. Well appearing and in no acute distress. Eyes: Conjunctiva is erythematous on the left.  Funduscopic exam reveals red reflex, vasculature, optic disc.  Pustular drainage is noted to the lower eyelashes.Marland Kitchen PERRL. EOMI. Head: Atraumatic. ENT:      Ears:       Nose: No congestion/rhinnorhea.      Mouth/Throat: Mucous membranes are moist.  Neck: No stridor.    Cardiovascular: Normal rate, regular rhythm. Normal S1 and S2.  Good peripheral circulation. Respiratory: Normal respiratory effort without tachypnea or retractions. Lungs CTAB. Good air entry to the bases with no decreased or absent breath sounds. Musculoskeletal: Full range of motion to all extremities. No gross deformities appreciated. Neurologic:  Normal speech and language. No gross focal neurologic deficits are appreciated.  Skin:  Skin is warm, dry and intact. No rash noted. Psychiatric: Mood and affect are normal. Speech and behavior are normal. Patient exhibits appropriate insight and judgement.   ____________________________________________   LABS (all labs ordered are listed, but only abnormal results are displayed)  Labs Reviewed - No data to display ____________________________________________  EKG   ____________________________________________  RADIOLOGY   No results found.  ____________________________________________    PROCEDURES  Procedure(s) performed:    Procedures    Medications  ciprofloxacin (CILOXAN) 0.3 % ophthalmic solution 2 drop (not administered)  ketorolac (ACULAR) 0.5 % ophthalmic solution 1 drop (not administered)     ____________________________________________   INITIAL IMPRESSION / ASSESSMENT AND PLAN / ED COURSE  Pertinent labs & imaging results that were available during my care of the patient were reviewed by me and considered in my medical decision making (see chart for details).  Review of the [place] CSRS was performed in accordance of the [facility] prior to  dispensing any controlled drugs.     Patient's diagnosis is consistent with bacterial conjunctivitis to the left eye.  Patient son had symptoms, has been treated for pinkeye and now entire family including the patient has pinkeye. Patient will be discharged home with prescriptions for cipro eyedrops and Acular. Patient is to follow up with ophthalmology as needed or otherwise directed. Patient is given ED precautions to return to the ED for any worsening or new symptoms.     ____________________________________________  FINAL CLINICAL IMPRESSION(S) / ED DIAGNOSES  Final diagnoses:  Acute bacterial conjunctivitis of left eye      NEW MEDICATIONS STARTED DURING THIS VISIT:  New Prescriptions   CIPROFLOXACIN (CILOXAN) 0.3 % OPHTHALMIC SOLUTION    Place 1 drop into the left eye every 2 (two) hours. Administer 1 drop, every 2 hours, while awake, for 2 days. Then 1 drop, every 4 hours, while awake, for the next 5 days.   KETOROLAC (ACULAR) 0.5 % OPHTHALMIC SOLUTION    Place 1 drop into the right eye 4 (four) times daily.  This chart was dictated using voice recognition software/Dragon. Despite best efforts to proofread, errors can occur which can change the meaning. Any change was purely unintentional.    Heberto, [place], PA-C 01/23/17 2130    Sharman Cheek, MD 01/23/17 [phone]

## 2017-01-23 NOTE — ED Triage Notes (Signed)
Reports everyone in the family with red swollen eyes.  Patient reports its primarily his left eye, but right is now affected.

## 2017-01-29 ENCOUNTER — Emergency Department
Admission: EM | Admit: 2017-01-29 | Discharge: 2017-01-29 | Disposition: A | Discharge status: Home / Self Care | Payer: 59 | Attending: Emergency Medicine | Admitting: Emergency Medicine

## 2017-01-29 ENCOUNTER — Encounter: Payer: Self-pay | Admitting: Emergency Medicine

## 2017-01-29 DIAGNOSIS — H1033 Unspecified acute conjunctivitis, bilateral: Secondary | ICD-10-CM

## 2017-01-29 DIAGNOSIS — Z79899 Other long term (current) drug therapy: Secondary | ICD-10-CM | POA: Diagnosis not present

## 2017-01-29 DIAGNOSIS — H1089 Other conjunctivitis: Secondary | ICD-10-CM | POA: Diagnosis not present

## 2017-01-29 DIAGNOSIS — H109 Unspecified conjunctivitis: Secondary | ICD-10-CM | POA: Diagnosis present

## 2017-01-29 DIAGNOSIS — F17210 Nicotine dependence, cigarettes, uncomplicated: Secondary | ICD-10-CM | POA: Diagnosis not present

## 2017-01-29 MED ORDER — TOBRAMYCIN 0.3 % OP SOLN: 1.0000 [drp] | OPHTHALMIC | 0 refills | Status: AC

## 2017-01-29 NOTE — Discharge Instructions (Signed)
Stop the previously prescribed antibiotic solution and begin the new antibiotic-steroid combo solution. Apply cool compresses to reduce eyelid swelling. You may continue to use the Acular as needed. Follow-up with Dr. Druscilla BrowniePorfilio for continued or worsening symptoms.

## 2017-01-29 NOTE — ED Notes (Signed)
Pt to the er for eye redness and facial swelling. Pt was dx with conjunctivitis and began drops on Monday. Work sent him home due to his eyes. Pt continues to have drainage and swelling to eyes bilaterally and dose have some vision difficulty. Pt just wants to go back to work. Eyes do not seem to be responding to treatment.

## 2017-01-29 NOTE — ED Triage Notes (Signed)
Pt was seen for conjunctivitis and eye swelling 1 week ago. Pt was given medication but states his job will not allow him back to work without a note stating he is medically clear to go back to work. No new complaints or changes in condition.

## 2017-01-30 NOTE — ED Provider Notes (Signed)
[facility] Emergency Department Provider Note ____________________________________________  Time seen: 2217  I have reviewed the triage vital signs and the nursing notes.  HISTORY  Chief Complaint  Conjunctivitis and Facial Swelling  HPI Julian Franklin is a 31 y.o. male returns to the ED after being evaluated and treated 1 week prior for conjunctivitis of the left eye.  He was placed on Cipro ophthalmic solution along with ketorolac ophthalmic after his son was diagnosed with conjunctivitis.  He presents today having dose of medications as prescribed, but notes now symptoms have progressed to the right eye and he continues to have irritation, redness, and purulent drainage.  He denies any fevers, nausea, vomiting, or dizziness.  He also notes swelling to the eyelids.  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  He denies any visual disturbance.  History reviewed. No pertinent past medical history.  There are no active problems to display for this patient.  Past Surgical History:  Procedure Laterality Date  . APPENDECTOMY    . INNER EAR SURGERY      Prior to Admission medications   Medication Sig Start Date End Date Taking? Authorizing Provider  brompheniramine-pseudoephedrine-DM 30-2-10 MG/5ML syrup Take 5 mLs by mouth 4 (four) times daily as needed. 10/10/16   Joni Reining, PA-C  clindamycin (CLEOCIN) 150 MG capsule Take 1 capsule (150 mg total) by mouth 4 (four) times daily. 01/11/17   Joni Reining, PA-C  doxycycline (VIBRAMYCIN) 100 MG capsule Take 1 capsule (100 mg total) by mouth 2 (two) times daily. 10/14/16   Myrna Blazer, MD  hydrOXYzine (ATARAX/VISTARIL) 50 MG tablet Take 1 tablet (50 mg total) by mouth 3 (three) times daily as needed for itching. 10/11/16   Joni Reining, PA-C  ibuprofen (ADVIL,MOTRIN) 600 MG tablet Take 1 tablet (600 mg total) by mouth every 8 (eight)  hours as needed. 10/10/16   Joni Reining, PA-C  ibuprofen (ADVIL,MOTRIN) 600 MG tablet Take 1 tablet (600 mg total) by mouth every 8 (eight) hours as needed. 01/11/17   Joni Reining, PA-C  ketorolac (ACULAR) 0.5 % ophthalmic solution Place 1 drop into the right eye 4 (four) times daily. 01/23/17   Cuthriell, Delorise Royals, PA-C  methylPREDNISolone (MEDROL DOSEPAK) 4 MG TBPK tablet Take Tapered dose as directed 10/11/16   Joni Reining, PA-C  sulfamethoxazole-trimethoprim (BACTRIM DS,SEPTRA DS) 800-160 MG tablet Take 1 tablet by mouth 2 (two) times daily. 01/03/17   Emaad Nanna, Charlesetta Ivory, PA-C  tobramycin (TOBREX) 0.3 % ophthalmic solution Place 1 drop every 4 (four) hours for 10 days into both eyes. 01/29/17 02/08/17  Nerea Bordenave, Charlesetta Ivory, PA-C  traMADol (ULTRAM) 50 MG tablet Take 1 tablet (50 mg total) by mouth every 6 (six) hours as needed for moderate pain. 10/10/16   Joni Reining, PA-C  traMADol (ULTRAM) 50 MG tablet Take 1 tablet (50 mg total) by mouth every 12 (twelve) hours as needed. 01/11/17   Joni Reining, PA-C    Allergies Ceclor [cefaclor]  History reviewed. No pertinent family history.  Social History Social History   Tobacco Use  . Smoking status: Current Every Day Smoker  . Smokeless tobacco: Never Used  Substance Use Topics  . Alcohol use: No  . Drug use: No    Review of Systems  Constitutional: Negative for fever. Eyes: Negative for visual changes.  Reports bilateral eye irritation, redness, and purulent drainage. ENT: Negative  for sore throat. Cardiovascular: Negative for chest pain. Respiratory: Negative for shortness of breath. Gastrointestinal: Negative for abdominal pain, vomiting and diarrhea. ____________________________________________  PHYSICAL EXAM:  VITAL SIGNS: ED Triage Vitals  Enc Vitals Group     BP 01/29/17 2106 (!) 144/92     Pulse Rate 01/29/17 2106 97     Resp 01/29/17 2106 18     Temp 01/29/17 2106 99 F (37.2 C)     Temp  Source 01/29/17 2106 Oral     SpO2 01/29/17 2106 98 %     Weight 01/29/17 2107 210 lb (95.3 kg)     Height 01/29/17 2107 5\' 5"  (1.651 m)     Head Circumference --      Peak Flow --      Pain Score 01/29/17 2106 0     Pain Loc --      Pain Edu? --      Excl. in GC? --     Constitutional: Alert and oriented. Well appearing and in no distress. Head: Normocephalic and atraumatic. Eyes: Conjunctivae are moderately injected bilaterally. He also has conjunctival edema and bilateral upper lid blepharitis. There is remnants of purulent drainage, evidenced by dried, yellow crusting on the lashes. PERRL. Normal extraocular movements Neck: Supple. No thyromegaly. Hematological/Lymphatic/Immunological: No preauricular lymphadenopathy. Cardiovascular: Normal rate, regular rhythm. Normal distal pulses. Respiratory: Normal respiratory effort.  Skin:  Skin is warm, dry and intact. No rash noted. ____________________________________________  INITIAL IMPRESSION / ASSESSMENT AND PLAN / ED COURSE  Patient with ED evaluation of continued and worsening right and left eye irritation, redness, and blepharitis.  He appears to have failed previous treatment with the ciprofloxacin ophthalmic solution.  He will be discharged at this time with instructions to discontinue that medication and begin the TobraDex ophthalmic solution every 4 hours.  He may continue with the Acular as previously prescribed.  I will head is provided allowed to return to work tomorrow since he has been on a one-week course of antibiotics.  Return precautions are reviewed.  [facility] for ongoing or worsening symptoms.  ____________________________________________  FINAL CLINICAL IMPRESSION(S) / ED DIAGNOSES  Final diagnoses:  Acute bacterial conjunctivitis of both eyes      Karmen StabsMenshew, Charlesetta IvoryJenise V Bacon, PA-C 01/30/17 0010    Sharman CheekStafford, Phillip, MD 02/03/17 2106

## 2017-06-20 ENCOUNTER — Other Ambulatory Visit: Payer: Self-pay

## 2017-06-20 ENCOUNTER — Emergency Department: Payer: 59

## 2017-06-20 ENCOUNTER — Emergency Department
Admission: EM | Admit: 2017-06-20 | Discharge: 2017-06-20 | Disposition: A | Discharge status: Home / Self Care | Payer: 59 | Attending: Emergency Medicine | Admitting: Emergency Medicine

## 2017-06-20 DIAGNOSIS — W228XXA Striking against or struck by other objects, initial encounter: Secondary | ICD-10-CM | POA: Insufficient documentation

## 2017-06-20 DIAGNOSIS — S92301A Fracture of unspecified metatarsal bone(s), right foot, initial encounter for closed fracture: Secondary | ICD-10-CM | POA: Insufficient documentation

## 2017-06-20 DIAGNOSIS — Y999 Unspecified external cause status: Secondary | ICD-10-CM | POA: Insufficient documentation

## 2017-06-20 DIAGNOSIS — Y939 Activity, unspecified: Secondary | ICD-10-CM | POA: Insufficient documentation

## 2017-06-20 DIAGNOSIS — Y929 Unspecified place or not applicable: Secondary | ICD-10-CM | POA: Insufficient documentation

## 2017-06-20 DIAGNOSIS — F17200 Nicotine dependence, unspecified, uncomplicated: Secondary | ICD-10-CM | POA: Insufficient documentation

## 2017-06-20 NOTE — ED Notes (Signed)
Pt refused wheelchair that was offered to lobby

## 2017-06-20 NOTE — ED Provider Notes (Signed)
[facility] Emergency Department Provider Note  ____________________________________________   First MD Initiated Contact with Patient 06/20/17 2118     (approximate)  I have reviewed the triage vital signs and the nursing notes.   HISTORY  Chief Complaint Foot Injury    HPI Julian Franklin is a 32 y.o. male department complaining of right foot pain.  He states he had a tire propped up under the door in case the jack fell while he is working on his car.  He states that Julian Franklin did fall and his foot was caught between Julian Franklin and the tire.  He is complaining of pain across the top of the midfoot.  He states it hurts to bear weight.  He denies any other injury at this time  History reviewed. No pertinent past medical history.  There are no active problems to display for this patient.   Past Surgical History:  Procedure Laterality Date  . APPENDECTOMY    . INNER EAR SURGERY      Prior to Admission medications   Not on File    Allergies Ceclor [cefaclor]  History reviewed. No pertinent family history.  Social History Social History   Tobacco Use  . Smoking status: Current Every Day Smoker  . Smokeless tobacco: Never Used  Substance Use Topics  . Alcohol use: No  . Drug use: No    Review of Systems  Constitutional: No fever/chills Eyes: No visual changes. ENT: No sore throat. Respiratory: Denies cough Genitourinary: Negative for dysuria. Musculoskeletal: Negative for back pain.  Positive for right foot pain Skin: Negative for rash.    ____________________________________________   PHYSICAL EXAM:  VITAL SIGNS: ED Triage Vitals  Enc Vitals Group     BP 06/20/17 2054 (!) 146/81     Pulse Rate 06/20/17 2054 83     Resp 06/20/17 2054 18     Temp 06/20/17 2054 98.6 F (37 C)     Temp Source 06/20/17 2054 Oral     SpO2 06/20/17 2054 97 %     Weight 06/20/17 2053 215 lb (97.5 kg)     Height 06/20/17 2053 5\' 5"  (1.651  m)     Head Circumference --      Peak Flow --      Pain Score 06/20/17 2053 6     Pain Loc --      Pain Edu? --      Excl. in GC? --     Constitutional: Alert and oriented. Well appearing and in no acute distress. Eyes: Conjunctivae are normal.  Head: Atraumatic. Nose: No congestion/rhinnorhea. Mouth/Throat: Mucous membranes are moist.   Cardiovascular: Normal rate, regular rhythm. Respiratory: Normal respiratory effort.  No retractions GU: deferred Musculoskeletal: FROM all extremities, warm and well perfused, the right foot is tender at the proximal edge of the fourth and fifth metatarsals.  There is small amount of swelling noted.  He is neurovascularly intact.  The ankle is not tender. Neurologic:  Normal speech and language.  Skin:  Skin is warm, dry and intact. No rash noted. Psychiatric: Mood and affect are normal. Speech and behavior are normal.  ____________________________________________   LABS (all labs ordered are listed, but only abnormal results are displayed)  Labs Reviewed - No data to display ____________________________________________   ____________________________________________  RADIOLOGY  X-ray of the right foot is read as negative by the radiologist but there is a slight fracture noted at the proximal fifth metatarsal  ____________________________________________   PROCEDURES  Procedure(s) performed: Ace wrap and wooden shoe were applied by the tech  Procedures    ____________________________________________   INITIAL IMPRESSION / ASSESSMENT AND PLAN / ED COURSE  Pertinent labs & imaging results that were available during my care of the patient were reviewed by me and considered in my medical decision making (see chart for details).  Patient is a 32 year old male complaining of right foot pain after his Julian Franklin fell off the jack and crushed his foot between a tire in the door.  On physical exam the right foot is tender and swollen  at the proximal fourth and fifth metatarsals  X-ray of the right foot was read negative by the radiologist, however I feel there may be a slight fracture at the proximal fifth metatarsal  X-ray results were discussed with the patient.  He is to follow-up with orthopedics for second opinion.  He was given an Ace wrap and wooden shoe to protect the foot.  He refuses pain medication.  He states he will just take over-the-counter Tylenol or ibuprofen.  He will elevate and ice the foot.  He was discharged in stable condition     As part of my medical decision making, I reviewed the following data within the electronic MEDICAL RECORD NUMBER Nursing notes reviewed and incorporated, Radiograph reviewed fracture of the proximal aspect of the fifth metatarsal, Notes from prior ED visits and [place] Controlled Substance Database  ____________________________________________   FINAL CLINICAL IMPRESSION(S) / ED DIAGNOSES  Final diagnoses:  Closed nondisplaced fracture of metatarsal bone of right foot, unspecified metatarsal, initial encounter      NEW MEDICATIONS STARTED DURING THIS VISIT:  Discharge Medication List as of 06/20/2017  9:42 PM       Note:  This document was prepared using Dragon voice recognition software and may include unintentional dictation errors.    Faythe Ghee, PA-C 06/20/17 2217    Phineas Semen, MD 06/20/17 2240

## 2017-06-20 NOTE — Discharge Instructions (Addendum)
Follow-up with Dr. Martha ClanKrasinski.  Call for an appointment.  Use the Ace wrap and wooden shoe when bearing weight on your foot.  The radiologist read your x-ray is negative.  However I have concerns of a fracture along the fifth metatarsal at the proximal area.  If you are worsening please return the emergency department.  Apply ice and elevate the foot.  Take over-the-counter medicines for pain as needed

## 2017-06-20 NOTE — ED Triage Notes (Signed)
Pt arrives to ED via POV from home with c/o RIGHT foot pain s/p crush injury when a jack stand failed and vehicle landed on foot. No obvious deformity or dislocation observed, CMS intact.

## 2018-01-13 ENCOUNTER — Other Ambulatory Visit: Payer: Self-pay

## 2018-01-13 ENCOUNTER — Encounter: Payer: Self-pay | Admitting: Emergency Medicine

## 2018-01-13 ENCOUNTER — Emergency Department
Admission: EM | Admit: 2018-01-13 | Discharge: 2018-01-13 | Disposition: A | Discharge status: Home / Self Care | Payer: No Typology Code available for payment source | Attending: Student in an Organized Health Care Education/Training Program | Admitting: Student in an Organized Health Care Education/Training Program

## 2018-01-13 DIAGNOSIS — F17200 Nicotine dependence, unspecified, uncomplicated: Secondary | ICD-10-CM | POA: Insufficient documentation

## 2018-01-13 DIAGNOSIS — M79622 Pain in left upper arm: Secondary | ICD-10-CM | POA: Diagnosis present

## 2018-01-13 DIAGNOSIS — M25512 Pain in left shoulder: Secondary | ICD-10-CM | POA: Diagnosis not present

## 2018-01-13 MED ORDER — CYCLOBENZAPRINE HCL 5 MG PO TABS: 5.0000 mg | ORAL_TABLET | Freq: Every day | ORAL | 0 refills | Status: AC

## 2018-01-13 MED ORDER — DEXAMETHASONE SODIUM PHOSPHATE 10 MG/ML IJ SOLN
10.0000 mg | Freq: Once | INTRAMUSCULAR | Status: AC
  Administered 2018-01-13: 10 mg via INTRAMUSCULAR
  Filled 2018-01-13: qty 1

## 2018-01-13 MED ORDER — NABUMETONE 750 MG PO TABS: 750.0000 mg | ORAL_TABLET | Freq: Two times a day (BID) | ORAL | 0 refills | Status: DC

## 2018-01-13 NOTE — ED Notes (Signed)
Pt assessed by PA Jenise.

## 2018-01-13 NOTE — Discharge Instructions (Signed)
Your exam is consistent with ED shoulder strain and mild tendinitis. Take the prescription meds as directed. Follow-up with ortho for ongoing symptoms.
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
## 2018-03-06 ENCOUNTER — Emergency Department: Payer: Medicaid Other

## 2018-03-06 ENCOUNTER — Other Ambulatory Visit: Payer: Self-pay

## 2018-03-06 ENCOUNTER — Emergency Department
Admission: EM | Admit: 2018-03-06 | Discharge: 2018-03-06 | Disposition: A | Discharge status: Home / Self Care | Payer: Medicaid Other | Attending: Emergency Medicine | Admitting: Emergency Medicine

## 2018-03-06 DIAGNOSIS — Y929 Unspecified place or not applicable: Secondary | ICD-10-CM | POA: Insufficient documentation

## 2018-03-06 DIAGNOSIS — S86912A Strain of unspecified muscle(s) and tendon(s) at lower leg level, left leg, initial encounter: Secondary | ICD-10-CM

## 2018-03-06 DIAGNOSIS — Y939 Activity, unspecified: Secondary | ICD-10-CM | POA: Insufficient documentation

## 2018-03-06 DIAGNOSIS — M25462 Effusion, left knee: Secondary | ICD-10-CM | POA: Insufficient documentation

## 2018-03-06 DIAGNOSIS — S838X2A Sprain of other specified parts of left knee, initial encounter: Secondary | ICD-10-CM | POA: Insufficient documentation

## 2018-03-06 DIAGNOSIS — R600 Localized edema: Secondary | ICD-10-CM | POA: Insufficient documentation

## 2018-03-06 DIAGNOSIS — F17200 Nicotine dependence, unspecified, uncomplicated: Secondary | ICD-10-CM | POA: Insufficient documentation

## 2018-03-06 DIAGNOSIS — R030 Elevated blood-pressure reading, without diagnosis of hypertension: Secondary | ICD-10-CM

## 2018-03-06 DIAGNOSIS — X58XXXA Exposure to other specified factors, initial encounter: Secondary | ICD-10-CM | POA: Insufficient documentation

## 2018-03-06 DIAGNOSIS — Y999 Unspecified external cause status: Secondary | ICD-10-CM | POA: Insufficient documentation

## 2018-03-06 DIAGNOSIS — R609 Edema, unspecified: Secondary | ICD-10-CM

## 2018-03-06 MED ORDER — HYDROCHLOROTHIAZIDE 25 MG PO TABS: 25.0000 mg | ORAL_TABLET | Freq: Every day | ORAL | 1 refills | Status: DC

## 2018-03-06 MED ORDER — HYDROCHLOROTHIAZIDE 25 MG PO TABS
25.0000 mg | ORAL_TABLET | Freq: Once | ORAL | Status: AC
  Administered 2018-03-06: 25 mg via ORAL
  Filled 2018-03-06: qty 1

## 2018-03-06 NOTE — ED Notes (Signed)
See triage note  Presents with pain to left knee   States pain is anterior knee  No swelling noted  denies any fall but states he felt a "pop" to knee while walking

## 2018-03-06 NOTE — Discharge Instructions (Addendum)
Your knee x-ray is negative at this time. You likely have a strain to the knee with some mild patella tendinitis. Take OTC Tylenol or ibuprofen as needed for pain. You have elevated blood pressure and some peripheral edema. Take the fluid pill daily, as directed. Rest with the legs elevated and avoid added salt. Drink water and non-carbonated drinks to reduce fluid retention. Follow-up with one of the local community clinics for routine medical care.

## 2018-03-06 NOTE — ED Triage Notes (Signed)
Left knee pain X 2 days ago, no injury, no hx of knee pain. States popping noise when walking. Pain anterior left knee only. Pt alert and oriented X4, active, cooperative, pt in NAD. RR even and unlabored, color WNL.

## 2018-03-07 NOTE — ED Provider Notes (Signed)
[facility] Emergency Department Provider Note ____________________________________________  Time seen: 1725  I have reviewed the triage vital signs and the nursing notes.  HISTORY  Chief Complaint  Knee Pain  HPI Julian Franklin is a 32 y.o. male presents to the ED for a 2-day complaint of left knee pain.  Patient is unaware of any preceding injury, accident, or trauma.  He describes a popping noise when walking.  He has had some knee weakness and fear of give way with ambulation.  He presents now with some swelling to the left knee and tenderness to the anterior kneecap.  He denies any history of chronic ongoing knee pain.  History reviewed. No pertinent past medical history.  There are no active problems to display for this patient.   Past Surgical History:  Procedure Laterality Date  . APPENDECTOMY    . INNER EAR SURGERY      Prior to Admission medications   Medication Sig Start Date End Date Taking? Authorizing Provider  hydrochlorothiazide (HYDRODIURIL) 25 MG tablet Take 1 tablet (25 mg total) by mouth daily. 03/06/18 05/05/18  Stepen Prins, Charlesetta IvoryJenise V Bacon, PA-C  nabumetone (RELAFEN) 750 MG tablet Take 1 tablet (750 mg total) by mouth 2 (two) times daily. 01/13/18   Maleeah Crossman, Charlesetta IvoryJenise V Bacon, PA-C    Allergies Ceclor [cefaclor]  History reviewed. No pertinent family history.  Social History Social History   Tobacco Use  . Smoking status: Current Every Day Smoker  . Smokeless tobacco: Never Used  Substance Use Topics  . Alcohol use: No  . Drug use: No    Review of Systems  Constitutional: Negative for fever. Eyes: Negative for visual changes. ENT: Negative for sore throat. Cardiovascular: Negative for chest pain.  Reports some swelling to the legs Respiratory: Negative for shortness of breath. Gastrointestinal: Negative for abdominal pain, vomiting and diarrhea. Genitourinary: Negative for dysuria. Musculoskeletal: Negative for back  pain.  Left knee pain as above. Skin: Negative for rash. Neurological: Negative for headaches, focal weakness or numbness. ____________________________________________  PHYSICAL EXAM:  VITAL SIGNS: ED Triage Vitals  Enc Vitals Group     BP 03/06/18 1653 (!) 143/90     Pulse Rate 03/06/18 1653 86     Resp 03/06/18 1653 18     Temp 03/06/18 1653 98.4 F (36.9 C)     Temp Source 03/06/18 1653 Oral     SpO2 03/06/18 1653 98 %     Weight 03/06/18 1654 230 lb (104.3 kg)     Height 03/06/18 1654 5\' 6"  (1.676 m)     Head Circumference --      Peak Flow --      Pain Score 03/06/18 1654 6     Pain Loc --      Pain Edu? --      Excl. in GC? --     Constitutional: Alert and oriented. Well appearing and in no distress. Head: Normocephalic and atraumatic. Eyes: Conjunctivae are normal. Normal extraocular movements Cardiovascular: Normal rate, regular rhythm. Normal distal pulses.  2+ pitting edema noted to the bilateral lower extremities from the knees and the ankles. Respiratory: Normal respiratory effort. No wheezes/rales/rhonchi. Musculoskeletal: Nontender with normal range of motion in all extremities. Left knee out obvious deformity or dislocation.  There is a small joint effusion appreciated.  Patient with normal flexion and extension range of the left knee without crepitus or laxity.  No significant valgus or varus joint stress is noted.  Patient is tender to palpation over  the inferior aspect of the patella at the patella tendon insertion.  No patellar ballottement is noted.  Negative anterior/posterior drawer sign.  Negative Lockman's and McMurray's on exam. Neurologic:  Antalgic gait without ataxia. Normal speech and language. No gross focal neurologic deficits are appreciated. Skin:  Skin is warm, dry and intact. No rash noted. ___________________________________________   RADIOLOGY  Left  Knee IMPRESSION: Negative. ____________________________________________  PROCEDURES  Procedures HCTZ 25 mg PO Knee immobilizer ____________________________________________  INITIAL IMPRESSION / ASSESSMENT AND PLAN / ED COURSE  Patient with ED evaluation of acute left knee pain on exam.  Without a recent injury patient symptoms likely represent mild joint effusion secondary to knee sprain.  He is tender to palpation over the patella tendon.  Additionally the patient has some peripheral edema noted bilaterally.  Review of the chart reveals he has had elevated blood pressures at every visit to the ED.  He denies any history of hypertension, high blood pressure, or previously been placed on blood pressure medicines.  He is aware of some increasing swelling to the legs noted bilaterally.  Patient was treated empirically for elevated blood pressure with a daily diuretic.  Prescription is provided for the patient to dose as directed.  He is referred to 1 of the local clinic clinics for further evaluation and management of his elevated blood pressure.  He is referred to orthopedics for his knee sprain if symptoms do not improve.  Patient verbalizes understanding and will return to the ED as necessary. ____________________________________________  FINAL CLINICAL IMPRESSION(S) / ED DIAGNOSES  Final diagnoses:  Effusion of left knee  Strain of left knee, initial encounter  Peripheral edema  Elevated blood pressure reading      Jelesa Mangini, Charlesetta Ivory, PA-C 03/07/18 2235    Nita Sickle, MD 03/10/18 [id number]

## 2019-01-11 ENCOUNTER — Emergency Department: Payer: Medicaid Other

## 2019-01-11 ENCOUNTER — Inpatient Hospital Stay
Admission: EM | Admit: 2019-01-11 | Discharge: 2019-01-13 | DRG: 871 | Disposition: A | Discharge status: Home / Self Care | Payer: Medicaid Other | Attending: Internal Medicine | Admitting: Internal Medicine

## 2019-01-11 ENCOUNTER — Other Ambulatory Visit: Payer: Self-pay

## 2019-01-11 ENCOUNTER — Inpatient Hospital Stay: Payer: Medicaid Other

## 2019-01-11 ENCOUNTER — Encounter: Payer: Self-pay | Admitting: Emergency Medicine

## 2019-01-11 DIAGNOSIS — R1110 Vomiting, unspecified: Secondary | ICD-10-CM | POA: Diagnosis present

## 2019-01-11 DIAGNOSIS — J189 Pneumonia, unspecified organism: Secondary | ICD-10-CM | POA: Diagnosis present

## 2019-01-11 DIAGNOSIS — F17200 Nicotine dependence, unspecified, uncomplicated: Secondary | ICD-10-CM | POA: Diagnosis present

## 2019-01-11 DIAGNOSIS — A419 Sepsis, unspecified organism: Principal | ICD-10-CM | POA: Diagnosis present

## 2019-01-11 DIAGNOSIS — Z20828 Contact with and (suspected) exposure to other viral communicable diseases: Secondary | ICD-10-CM | POA: Diagnosis present

## 2019-01-11 DIAGNOSIS — L03115 Cellulitis of right lower limb: Secondary | ICD-10-CM | POA: Diagnosis present

## 2019-01-11 DIAGNOSIS — J45909 Unspecified asthma, uncomplicated: Secondary | ICD-10-CM | POA: Diagnosis present

## 2019-01-11 DIAGNOSIS — Z23 Encounter for immunization: Secondary | ICD-10-CM

## 2019-01-11 DIAGNOSIS — Z881 Allergy status to other antibiotic agents status: Secondary | ICD-10-CM

## 2019-01-11 DIAGNOSIS — R509 Fever, unspecified: Secondary | ICD-10-CM

## 2019-01-11 HISTORY — DX: Unspecified asthma, uncomplicated: J45.909

## 2019-01-11 LAB — COMPREHENSIVE METABOLIC PANEL
ALT: 27 U/L (ref 0–44)
AST: 30 U/L (ref 15–41)
Albumin: 4.2 g/dL (ref 3.5–5.0)
Alkaline Phosphatase: 64 U/L (ref 38–126)
Anion gap: 9 (ref 5–15)
BUN: 12 mg/dL (ref 6–20)
CO2: 24 mmol/L (ref 22–32)
Calcium: 9.1 mg/dL (ref 8.9–10.3)
Chloride: 101 mmol/L (ref 98–111)
Creatinine, Ser: 1.1 mg/dL (ref 0.61–1.24)
GFR calc Af Amer: >60 mL/min (ref >60)
GFR calc non Af Amer: >60 mL/min (ref >60)
Glucose, Bld: 124 mg/dL — ABNORMAL HIGH (ref 70–99)
Potassium: 4 mmol/L (ref 3.5–5.1)
Sodium: 134 mmol/L — ABNORMAL LOW (ref 135–145)
Total Bilirubin: 0.9 mg/dL (ref 0.3–1.2)
Total Protein: 7.7 g/dL (ref 6.5–8.1)

## 2019-01-11 LAB — CBC WITH DIFFERENTIAL/PLATELET
Abs Immature Granulocytes: 0.09 10*3/uL — ABNORMAL HIGH (ref 0.00–0.07)
Basophils Absolute: 0 10*3/uL (ref 0.0–0.1)
Basophils Relative: 0 %
Eosinophils Absolute: 0 10*3/uL (ref 0.0–0.5)
Eosinophils Relative: 0 %
HCT: 42.1 % (ref 39.0–52.0)
Hemoglobin: 13.6 g/dL (ref 13.0–17.0)
Immature Granulocytes: 1 %
Lymphocytes Relative: 12 %
Lymphs Abs: 1.9 10*3/uL (ref 0.7–4.0)
MCH: 27 pg (ref 26.0–34.0)
MCHC: 32.3 g/dL (ref 30.0–36.0)
MCV: 83.5 fL (ref 80.0–100.0)
Monocytes Absolute: 0.9 10*3/uL (ref 0.1–1.0)
Monocytes Relative: 5 %
Neutro Abs: 12.9 10*3/uL — ABNORMAL HIGH (ref 1.7–7.7)
Neutrophils Relative %: 82 %
Platelets: 247 10*3/uL (ref 150–400)
RBC: 5.04 MIL/uL (ref 4.22–5.81)
RDW: 13.5 % (ref 11.5–15.5)
WBC: 15.8 10*3/uL — ABNORMAL HIGH (ref 4.0–10.5)
nRBC: 0 % (ref 0.0–0.2)

## 2019-01-11 LAB — URINALYSIS, ROUTINE W REFLEX MICROSCOPIC
Bacteria, UA: NONE SEEN
Bilirubin Urine: NEGATIVE
Glucose, UA: NEGATIVE mg/dL
Ketones, ur: NEGATIVE mg/dL
Leukocytes,Ua: NEGATIVE
Nitrite: NEGATIVE
Protein, ur: 30 mg/dL — AB
Specific Gravity, Urine: 1.024 (ref 1.005–1.030)
Squamous Epithelial / HPF: NONE SEEN (ref 0–5)
pH: 5 (ref 5.0–8.0)

## 2019-01-11 LAB — APTT: aPTT: 37 seconds — ABNORMAL HIGH (ref 24–36)

## 2019-01-11 LAB — SARS CORONAVIRUS 2 (TAT 6-24 HRS): SARS Coronavirus 2: NEGATIVE

## 2019-01-11 LAB — PROTIME-INR
INR: 1.1 (ref 0.8–1.2)
Prothrombin Time: 14 seconds (ref 11.4–15.2)

## 2019-01-11 LAB — HIV ANTIBODY (ROUTINE TESTING W REFLEX): HIV Screen 4th Generation wRfx: NONREACTIVE

## 2019-01-11 LAB — LACTIC ACID, PLASMA: Lactic Acid, Venous: 1.7 mmol/L (ref 0.5–1.9)

## 2019-01-11 LAB — TROPONIN I (HIGH SENSITIVITY): Troponin I (High Sensitivity): 21 ng/L — ABNORMAL HIGH (ref <18)

## 2019-01-11 MED ORDER — SODIUM CHLORIDE 0.9 % IV SOLN
500.0000 mg | Freq: Once | INTRAVENOUS | Status: AC
  Administered 2019-01-11: 500 mg via INTRAVENOUS
  Filled 2019-01-11: qty 500

## 2019-01-11 MED ORDER — VANCOMYCIN HCL 1.25 G IV SOLR
1250.0000 mg | Freq: Once | INTRAVENOUS | Status: AC
  Administered 2019-01-11: 1250 mg via INTRAVENOUS
  Filled 2019-01-11: qty 1250

## 2019-01-11 MED ORDER — SODIUM CHLORIDE 0.9 % IV SOLN
1.0000 g | Freq: Three times a day (TID) | INTRAVENOUS | Status: DC
  Administered 2019-01-11 – 2019-01-13 (×5): 1 g via INTRAVENOUS
  Filled 2019-01-11 (×9): qty 1

## 2019-01-11 MED ORDER — ENOXAPARIN SODIUM 40 MG/0.4ML ~~LOC~~ SOLN
40.0000 mg | Freq: Two times a day (BID) | SUBCUTANEOUS | Status: DC
  Administered 2019-01-11 – 2019-01-13 (×5): 40 mg via SUBCUTANEOUS
  Filled 2019-01-11 (×5): qty 0.4

## 2019-01-11 MED ORDER — INFLUENZA VAC SPLIT QUAD 0.5 ML IM SUSY
0.5000 mL | PREFILLED_SYRINGE | INTRAMUSCULAR | Status: AC
  Administered 2019-01-13: 0.5 mL via INTRAMUSCULAR
  Filled 2019-01-11: qty 0.5

## 2019-01-11 MED ORDER — CLINDAMYCIN PHOSPHATE 600 MG/50ML IV SOLN
600.0000 mg | Freq: Once | INTRAVENOUS | Status: AC
  Administered 2019-01-11: 600 mg via INTRAVENOUS
  Filled 2019-01-11: qty 50

## 2019-01-11 MED ORDER — ACETAMINOPHEN 325 MG PO TABS
650.0000 mg | ORAL_TABLET | Freq: Once | ORAL | Status: AC
  Administered 2019-01-11: 650 mg via ORAL
  Filled 2019-01-11: qty 2

## 2019-01-11 MED ORDER — SODIUM CHLORIDE 0.9 % IV SOLN
1.0000 g | Freq: Once | INTRAVENOUS | Status: AC
  Administered 2019-01-11: 1 g via INTRAVENOUS
  Filled 2019-01-11: qty 1

## 2019-01-11 MED ORDER — VANCOMYCIN HCL IN DEXTROSE 1-5 GM/200ML-% IV SOLN
1000.0000 mg | Freq: Once | INTRAVENOUS | Status: AC
  Administered 2019-01-11: 1000 mg via INTRAVENOUS
  Filled 2019-01-11: qty 200

## 2019-01-11 MED ORDER — SODIUM CHLORIDE 0.9 % IV BOLUS
1000.0000 mL | Freq: Once | INTRAVENOUS | Status: AC
  Administered 2019-01-11: 1000 mL via INTRAVENOUS

## 2019-01-11 MED ORDER — SODIUM CHLORIDE 0.9 % IV SOLN
INTRAVENOUS | Status: DC
  Administered 2019-01-11 – 2019-01-12 (×2): via INTRAVENOUS

## 2019-01-11 MED ORDER — VANCOMYCIN HCL IN DEXTROSE 1-5 GM/200ML-% IV SOLN
1000.0000 mg | Freq: Two times a day (BID) | INTRAVENOUS | Status: DC
  Administered 2019-01-12: 1000 mg via INTRAVENOUS
  Filled 2019-01-11 (×3): qty 200

## 2019-01-11 NOTE — H&P (Signed)
[facility] [place] [facility]   PATIENT NAME: Julian Franklin    MR#:  [id number]  DATE OF BIRTH:  December 28, 1985  DATE OF ADMISSION:  01/11/2019  PRIMARY CARE PHYSICIAN: Patient, No Pcp Per   REQUESTING/REFERRING PHYSICIAN: Dorothea Glassman  CHIEF COMPLAINT:   Chief Complaint  Patient presents with   Shortness of Breath   Cough    HISTORY OF PRESENT ILLNESS:  Maurico Perrell  is a 33 y.o. male with a known history of asthma who presented to the emergency room with complaints of 2-day history of shortness of breath and cough productive of sputum with fevers.  Also complains of swelling and redness of right lower extremity over the last 2 days.  Patient denies any sick contact.  Had an episode of vomiting associated with the coughing.  No diarrhea.  No abdominal pain.  Patient was evaluated in the emergency room and found to be tachycardic with heart rate in the 120s.  Had fever with temperature of 102.5.  Chest x-ray done with findings suggestive of atypical pneumonia.  Patient diagnosed with sepsis secondary to atypical pneumonia and right lower extremity cellulitis.  Was already given 1 L of IV fluids in the emergency room.  At the time of my evaluation noted drop in blood pressure and was rechecked and found to be 118/86.  Patient currently getting second liter of IV fluids.  Patient being admitted to medical service for further evaluation and management.  PAST MEDICAL HISTORY:   Past Medical History:  Diagnosis Date   Asthma     PAST SURGICAL HISTORY:   Past Surgical History:  Procedure Laterality Date   APPENDECTOMY     INNER EAR SURGERY      SOCIAL HISTORY:   Social History   Tobacco Use   Smoking status: Current Every Day Smoker   Smokeless tobacco: Never Used  Substance Use Topics   Alcohol use: No    FAMILY HISTORY:  History reviewed. No pertinent family history.  DRUG ALLERGIES:   Allergies  Allergen Reactions   Ceclor  [Cefaclor] Hives    REVIEW OF SYSTEMS:   Review of Systems  Constitutional: Positive for fever. Negative for chills.  HENT: Negative for hearing loss and tinnitus.   Eyes: Negative for blurred vision and double vision.  Respiratory: Positive for cough, sputum production and shortness of breath.   Cardiovascular: Negative for chest pain and palpitations.  Gastrointestinal: Positive for vomiting. Negative for abdominal pain, diarrhea, heartburn and nausea.  Genitourinary: Negative for dysuria and urgency.  Musculoskeletal: Negative for myalgias and neck pain.  Skin: Negative for itching.       Redness and swelling of right lower extremity  Neurological: Negative for dizziness and headaches.  Psychiatric/Behavioral: Negative for depression and hallucinations.    MEDICATIONS AT HOME:   Prior to Admission medications   Medication Sig Start Date End Date Taking? Authorizing Provider  nabumetone (RELAFEN) 750 MG tablet Take 1 tablet (750 mg total) by mouth 2 (two) times daily. Patient not taking: Reported on 01/11/2019 01/13/18   Menshew, Charlesetta Ivory, PA-C      VITAL SIGNS:  Blood pressure 101/80, pulse (!) 103, temperature (!) 102.5 F (39.2 C), temperature source Oral, resp. rate (!) 29, height 5\' 6"  (1.676 m), weight 117 kg, SpO2 97 %.  PHYSICAL EXAMINATION:  Physical Exam  GENERAL:  33 y.o.-year-old patient lying in the bed with no acute distress.  EYES: Pupils equal, round, reactive to light and accommodation. No  scleral icterus. Extraocular muscles intact.  HEENT: Head atraumatic, normocephalic. Oropharynx and nasopharynx clear.  NECK:  Supple, no jugular venous distention. No thyroid enlargement, no tenderness.  LUNGS: Rhonchi bilaterally. No wheezing, rales,rhonchi or crepitation. No use of accessory muscles of respiration.  CARDIOVASCULAR: S1, S2 normal. No murmurs, rubs, or gallops.  ABDOMEN: Soft, nontender, nondistended. Bowel sounds present. No organomegaly or mass.   EXTREMITIES: Swelling , redness and tenderness of right lower extremity.  No pedal edema, cyanosis, or clubbing.  NEUROLOGIC: Cranial nerves II through XII are intact. Muscle strength 5/5 in all extremities. Sensation intact. Gait not checked.  PSYCHIATRIC: The patient is alert and oriented x 3.  SKIN: Redness and swelling of right lower extremity as evidenced on imaging below      LABORATORY PANEL:   CBC Recent Labs  Lab 01/11/19 0813  WBC 15.8*  HGB 13.6  HCT 42.1  PLT 247   ------------------------------------------------------------------------------------------------------------------  Chemistries  Recent Labs  Lab 01/11/19 0813  NA 134*  K 4.0  CL 101  CO2 24  GLUCOSE 124*  BUN 12  CREATININE 1.10  CALCIUM 9.1  AST 30  ALT 27  ALKPHOS 64  BILITOT 0.9   ------------------------------------------------------------------------------------------------------------------  Cardiac Enzymes No results for input(s): TROPONINI in the last 168 hours. ------------------------------------------------------------------------------------------------------------------  RADIOLOGY:  Dg Chest Port 1 View  Result Date: 01/11/2019 CLINICAL DATA:  Cough and shortness of breath. EXAM: PORTABLE CHEST 1 VIEW COMPARISON:  None. FINDINGS: Normal cardiac silhouette and mediastinal contours given slightly reduced lung volumes. There is diffuse slightly nodular thickening of the pulmonary interstitium. No discrete focal airspace opacities. No pleural effusion or pneumothorax. No evidence of edema. No acute osseous abnormalities. IMPRESSION: Diffuse slightly nodular thickening of the pulmonary interstitium as could be seen in the setting airways disease/bronchitis, though note, atypical infection could have a similar appearance. Clinical correlation is advised. Electronically Signed   By: Sandi Mariscal M.D.   On: 01/11/2019 08:32      IMPRESSION AND PLAN:  Patient is a 33 year old male  with history of asthma being admitted with sepsis secondary to atypical pneumonia and right lower extremity cellulitis.  1.  Sepsis Secondary to atypical pneumonia and right lower extremity cellulitis Continue IV fluid hydration.  Patient getting second liter of IV fluids.  Follow-up on cultures.  Placed on broad-spectrum IV antibiotics with IV vancomycin and meropenem.  Avoiding cephalosporins since patient allergic Monitor clinically  2.  Atypical pneumonia Placed on broad-spectrum IV antibiotics as mentioned above Follow-up on Covid test when ready. Droplet isolation until Covid ruled out.  3.  Right lower extremity cellulitis Placed on broad-spectrum IV antibiotics as mentioned above. Monitor clinically.  4.  History of asthma Stable  DVT prophylaxis; Lovenox   All the records are reviewed and case discussed with ED provider. Management plans discussed with the patient, and he is in agreement.  CODE STATUS: Full code  TOTAL TIME TAKING CARE OF THIS PATIENT: 61 minutes.    Eudell Julian M.D on 01/11/2019 at 11:22 AM  Between 7am to 6pm - Pager - [phone]  After 6pm go to [email or web address] - Proofreader  [facility] [place] [facility]  Office  [phone]  CC: Primary care physician; Patient, No Pcp Per   Note: This dictation was prepared with Dragon dictation along with smaller phrase technology. Any transcriptional errors that result from this process are unintentional.

## 2019-01-11 NOTE — Progress Notes (Signed)
   01/11/19 1700  Clinical Encounter Type  Visited With Patient not available  Visit Type Initial  Referral From Nurse  Consult/Referral To Chaplain  Spiritual Encounters  Spiritual Needs Literature  Chaplain received OR for AD and visit patient, but he was asleep. Chaplain told nurse Cory Roughen) she visited and someone will return tomorrow.

## 2019-01-11 NOTE — Progress Notes (Signed)
Anticoagulation monitoring(Lovenox):  33yo male ordered Lovenox 40 mg Q24h  Filed Weights   01/11/19 0742 01/11/19 0837  Weight: 230 lb (104.3 kg) 257 lb 15 oz (117 kg)   BMI 41.63   Lab Results  Component Value Date   CREATININE 1.10 01/11/2019   CREATININE 0.82 10/14/2016   Estimated Creatinine Clearance: 112.8 mL/min (by C-G formula based on SCr of 1.1 mg/dL). Hemoglobin & Hematocrit     Component Value Date/Time   HGB 13.6 01/11/2019 0813   HCT 42.1 01/11/2019 0813     Per Protocol for Patient with estCrcl > 30 ml/min and BMI >40, will transition to Lovenox 40 mg Q12h.

## 2019-01-11 NOTE — Consult Note (Signed)
Pharmacy Antibiotic Note  Julian Franklin is a 33 y.o. male admitted on 01/11/2019 with sepsis/cellulitis.  Pharmacy has been consulted for Vancomycin/Meropenem dosing.  Plan: Will give Meropenem 1g q 8h  Pt received 1000mg  Vancomycin IV x 1 in the ED - will follow immediately with 1250mg  to complete the load.  Will then dose Vancomycin 1000 mg IV Q 12 hrs. Goal AUC 400-550. Expected AUC: 458 SCr used: 1.1   Height: 5\' 6"  (167.6 cm) Weight: 257 lb 15 oz (117 kg) IBW/kg (Calculated) : 63.8  Temp (24hrs), Avg:102.5 F (39.2 C), Min:102.5 F (39.2 C), Max:102.5 F (39.2 C)  Recent Labs  Lab 01/11/19 0813  WBC 15.8*  CREATININE 1.10  LATICACIDVEN 1.7    Estimated Creatinine Clearance: 112.8 mL/min (by C-G formula based on SCr of 1.1 mg/dL).    Allergies  Allergen Reactions  . Ceclor [Cefaclor] Hives    Antimicrobials this admission: Azithromycin/Clindamycin x 1 Meropenem 10/16 >> Vancomycin 10/16 >>  Dose adjustments this admission: None  Microbiology results: 10/16 BCx/UCx/COVID pending  Thank you for allowing pharmacy to be a part of this patient's care.  Lu Duffel, PharmD, BCPS Clinical Pharmacist 01/11/2019 12:18 PM

## 2019-01-11 NOTE — ED Triage Notes (Signed)
Here for cough and SHOB. Pt reports SHOB at rest. Unlabored at check in but tachypnea noted.  C/o chest pain from cough and sore throat as well.  Also reports redness to RLE starting yesterday.  Appears to have cellulitis like redness to right anterior lower leg.  No known fevers but chills.

## 2019-01-11 NOTE — Consult Note (Signed)
CODE SEPSIS - PHARMACY COMMUNICATION  **Broad Spectrum Antibiotics should be administered within 1 hour of Sepsis diagnosis**  Time Code Sepsis Called/Page Received: 0815  Antibiotics Ordered: azithromycin and clindamycin  Time of 1st antibiotic administration: 0836  Additional action taken by pharmacy: none required  If necessary, Name of Provider/Nurse Contacted: N/A    Dallie Piles ,PharmD Clinical Pharmacist  01/11/2019  9:25 AM

## 2019-01-11 NOTE — ED Provider Notes (Addendum)
[facility] Emergency Department Provider Note   ____________________________________________   First MD Initiated Contact with Patient 01/11/19 [phone]     (approximate)  I have reviewed the triage vital signs and the nursing notes.   HISTORY  Chief Complaint Shortness of Breath and Cough    HPI Julian Franklin is a 33 y.o. male patient with redness and swelling of the right shin starting yesterday.  Last night he began to have a cough and then today got short of breath.  Here he has a fever and is tachycardic.  Patient reports the cough is productive of small amounts of thin sputum.         Past Medical History:  Diagnosis Date   Asthma   Patient's past medical history includes episodes of prior cellulitis, fracture of the foot and left knee effusion.  There are no active problems to display for this patient.   Past Surgical History:  Procedure Laterality Date   APPENDECTOMY     INNER EAR SURGERY      Prior to Admission medications   Medication Sig Start Date End Date Taking? Authorizing Provider  hydrochlorothiazide (HYDRODIURIL) 25 MG tablet Take 1 tablet (25 mg total) by mouth daily. 03/06/18 05/05/18  Menshew, Dannielle Karvonen, PA-C  nabumetone (RELAFEN) 750 MG tablet Take 1 tablet (750 mg total) by mouth 2 (two) times daily. 01/13/18   Menshew, Dannielle Karvonen, PA-C    Allergies Ceclor [cefaclor]  History reviewed. No pertinent family history.  Social History Social History   Tobacco Use   Smoking status: Current Every Day Smoker   Smokeless tobacco: Never Used  Substance Use Topics   Alcohol use: No   Drug use: No    Review of Systems  Constitutional:  fever/chills Eyes: No visual changes. ENT: No sore throat. Cardiovascular: Denies chest pain. Respiratory:shortness of breath. Gastrointestinal: No abdominal pain.  No nausea, no vomiting.  No diarrhea.  No constipation. Genitourinary: Negative for  dysuria. Musculoskeletal: Negative for back pain. Skin: Negative for rash. Neurological: Negative for headaches, focal weakness   ____________________________________________   PHYSICAL EXAM:  VITAL SIGNS: ED Triage Vitals  Enc Vitals Group     BP 01/11/19 0757 117/83     Pulse Rate 01/11/19 0747 (!) 123     Resp 01/11/19 0752 (!) 35     Temp 01/11/19 0758 (!) 102.5 F (39.2 C)     Temp Source 01/11/19 0758 Oral     SpO2 01/11/19 0747 92 %     Weight 01/11/19 0742 230 lb (104.3 kg)     Height 01/11/19 0742 5\' 6"  (1.676 m)     Head Circumference --      Peak Flow --      Pain Score 01/11/19 0742 8     Pain Loc --      Pain Edu? --      Excl. in [place]? --     Constitutional: Alert and oriented. Well appearing and in no acute distress! Eyes: Conjunctivae are normal.  Head: Atraumatic. Nose: No congestion/rhinnorhea. Mouth/Throat: Mucous membranes are moist.  Oropharynx non-erythematous. Neck: No stridor. Cardiovascular: Rapid rate, regular rhythm. Grossly normal heart sounds.  Good peripheral circulation. Respiratory: Normal respiratory effort.  No retractions. Lungs CTAB. Gastrointestinal: Soft and nontender. No distention. No abdominal bruits. No CVA tenderness. Musculoskeletal: Right leg has a patch about 6 inches x 9 inches on the shin which is red warm and tender.  There are some diffuse swelling in the  lower leg on that side.  Otherwise no noticeable musculoskeletal problems Neurologic:  Normal speech and language. No gross focal neurologic deficits are appreciated.  Skin:  Skin is warm, dry and intact. No rash noted except see above musculoskeletal. Psychiatric: Mood and affect are normal. Speech and behavior are normal.  ____________________________________________   LABS (all labs ordered are listed, but only abnormal results are displayed)  Labs Reviewed  COMPREHENSIVE METABOLIC PANEL - Abnormal; Notable for the following components:      Result Value   Sodium  134 (*)    Glucose, Bld 124 (*)    All other components within normal limits  CBC WITH DIFFERENTIAL/PLATELET - Abnormal; Notable for the following components:   WBC 15.8 (*)    Neutro Abs 12.9 (*)    Abs Immature Granulocytes 0.09 (*)    All other components within normal limits  APTT - Abnormal; Notable for the following components:   aPTT 37 (*)    All other components within normal limits  URINALYSIS, ROUTINE W REFLEX MICROSCOPIC - Abnormal; Notable for the following components:   Color, Urine YELLOW (*)    APPearance CLEAR (*)    Hgb urine dipstick MODERATE (*)    Protein, ur 30 (*)    All other components within normal limits  SARS CORONAVIRUS 2 (TAT 6-24 HRS)  CULTURE, BLOOD (ROUTINE X 2)  CULTURE, BLOOD (ROUTINE X 2)  URINE CULTURE  LACTIC ACID, PLASMA  PROTIME-INR  LACTIC ACID, PLASMA  TROPONIN I (HIGH SENSITIVITY)  TROPONIN I (HIGH SENSITIVITY)   ____________________________________________  EKG  EKG read interpreted by me shows sinus tachycardia rate of 124 left axis irregular baseline no obvious ST or T changes.  There is very poor R wave progression. ____________________________________________  RADIOLOGY  ED MD interpretation:   Official radiology report(s): Dg Chest Port 1 View  Result Date: 01/11/2019 CLINICAL DATA:  Cough and shortness of breath. EXAM: PORTABLE CHEST 1 VIEW COMPARISON:  None. FINDINGS: Normal cardiac silhouette and mediastinal contours given slightly reduced lung volumes. There is diffuse slightly nodular thickening of the pulmonary interstitium. No discrete focal airspace opacities. No pleural effusion or pneumothorax. No evidence of edema. No acute osseous abnormalities. IMPRESSION: Diffuse slightly nodular thickening of the pulmonary interstitium as could be seen in the setting airways disease/bronchitis, though note, atypical infection could have a similar appearance. Clinical correlation is advised. Electronically Signed   By: Simonne Come M.D.   On: 01/11/2019 08:32    ____________________________________________   PROCEDURES  Procedure(s) performed (including Critical Care): Critical care time 30 minutes.  This includes reviewing the patient's old records monitoring his fluid status and speaking with the hospitalist.  Procedures   ____________________________________________   INITIAL IMPRESSION / ASSESSMENT AND PLAN / ED COURSE  Patient with some slight infiltrate on chest x-ray had a cough and also cellulitis in the leg.  He came in very tachycardic with a low blood pressure and high fever.  He meets sepsis criteria.  We will get him some antibiotics and I think because of his fairly low blood pressure and tachycardia even after fluids we will get him in the hospital.  Coronavirus test is pending.     Rakeen Gaillard was evaluated in Emergency Department on 01/11/2019 for the symptoms described in the history of present illness. He was evaluated in the context of the global COVID-19 pandemic, which necessitated consideration that the patient might be at risk for infection with the SARS-CoV-2 virus that causes COVID-19. Institutional protocols  and algorithms that pertain to the evaluation of patients at risk for COVID-19 are in a state of rapid change based on information released by regulatory bodies including the CDC and federal and state organizations. These policies and algorithms were followed during the patient's care in the ED.         ____________________________________________   FINAL CLINICAL IMPRESSION(S) / ED DIAGNOSES  Final diagnoses:  Sepsis, due to unspecified organism, unspecified whether acute organ dysfunction present [facility](HCC)     ED Discharge Orders    None       Note:  This document was prepared using Dragon voice recognition software and may include unintentional dictation errors.    Arnaldo NatalMalinda, Paublo Warshawsky F, MD 01/11/19 1031    Arnaldo NatalMalinda, Malgorzata Albert F, MD 01/11/19 [phone]

## 2019-01-12 LAB — CBC
HCT: 41 % (ref 39.0–52.0)
Hemoglobin: 12.8 g/dL — ABNORMAL LOW (ref 13.0–17.0)
MCH: 26.9 pg (ref 26.0–34.0)
MCHC: 31.2 g/dL (ref 30.0–36.0)
MCV: 86.1 fL (ref 80.0–100.0)
Platelets: 231 10*3/uL (ref 150–400)
RBC: 4.76 MIL/uL (ref 4.22–5.81)
RDW: 13.6 % (ref 11.5–15.5)
WBC: 11.8 10*3/uL — ABNORMAL HIGH (ref 4.0–10.5)
nRBC: 0 % (ref 0.0–0.2)

## 2019-01-12 LAB — BASIC METABOLIC PANEL
Anion gap: 4 — ABNORMAL LOW (ref 5–15)
BUN: 8 mg/dL (ref 6–20)
CO2: 26 mmol/L (ref 22–32)
Calcium: 8.4 mg/dL — ABNORMAL LOW (ref 8.9–10.3)
Chloride: 109 mmol/L (ref 98–111)
Creatinine, Ser: 0.72 mg/dL (ref 0.61–1.24)
GFR calc Af Amer: >60 mL/min (ref >60)
GFR calc non Af Amer: >60 mL/min (ref >60)
Glucose, Bld: 101 mg/dL — ABNORMAL HIGH (ref 70–99)
Potassium: 3.8 mmol/L (ref 3.5–5.1)
Sodium: 139 mmol/L (ref 135–145)

## 2019-01-12 LAB — TROPONIN I (HIGH SENSITIVITY): Troponin I (High Sensitivity): 14 ng/L (ref <18)

## 2019-01-12 LAB — PROCALCITONIN: Procalcitonin: 0.16 ng/mL

## 2019-01-12 LAB — PROTIME-INR
INR: 1 (ref 0.8–1.2)
Prothrombin Time: 12.9 seconds (ref 11.4–15.2)

## 2019-01-12 LAB — URINE CULTURE: Culture: NO GROWTH

## 2019-01-12 LAB — CORTISOL-AM, BLOOD: Cortisol - AM: 13.1 ug/dL (ref 6.7–22.6)

## 2019-01-12 LAB — MAGNESIUM: Magnesium: 2 mg/dL (ref 1.7–2.4)

## 2019-01-12 MED ORDER — ACETAMINOPHEN 325 MG PO TABS
650.0000 mg | ORAL_TABLET | Freq: Four times a day (QID) | ORAL | Status: DC | PRN
  Administered 2019-01-12 – 2019-01-13 (×4): 650 mg via ORAL
  Filled 2019-01-12 (×4): qty 2

## 2019-01-12 MED ORDER — VANCOMYCIN HCL 1.25 G IV SOLR
1250.0000 mg | Freq: Two times a day (BID) | INTRAVENOUS | Status: DC
  Administered 2019-01-12 – 2019-01-13 (×2): 1250 mg via INTRAVENOUS
  Filled 2019-01-12 (×5): qty 1250

## 2019-01-12 NOTE — Consult Note (Signed)
Pharmacy Antibiotic Note  Julian Franklin is a 33 y.o. male admitted on 01/11/2019 with sepsis/cellulitis. Pharmacy has been consulted for Vancomycin/Meropenem dosing.  Plan: Renal function improved  Will change vancomycin to 1250 mg q12h Goal AUC 400-550 Expected AUC: 423 SCr used: 0.8  Continue meropenem 1 g q8h  Height: 5\' 6"  (167.6 cm) Weight: 257 lb 15 oz (117 kg) IBW/kg (Calculated) : 63.8  Temp (24hrs), Avg:98.5 F (36.9 C), Min:97.7 F (36.5 C), Max:99.4 F (37.4 C)  Recent Labs  Lab 01/11/19 0813 01/12/19 0515  WBC 15.8* 11.8*  CREATININE 1.10 0.72  LATICACIDVEN 1.7  --     Estimated Creatinine Clearance: 155.1 mL/min (by C-G formula based on SCr of 0.72 mg/dL).    Allergies  Allergen Reactions  . Ceclor [Cefaclor] Hives    Antimicrobials this admission: Azithromycin/Clindamycin x 1 Meropenem 10/16 >> Vancomycin 10/16 >>  Dose adjustments this admission: None  Microbiology results: 10/16 BCx: NGTD 10/16 UCx: negative 10/16 COVID: negative  Thank you for allowing pharmacy to be a part of this patient's care.  Tawnya Crook, PharmD Clinical Pharmacist 01/12/2019 11:33 AM

## 2019-01-12 NOTE — Progress Notes (Signed)
[employer] - [place] at [facility]                                                                                                                                                                                  Patient Demographics   Julian Franklin, is a 33 y.o. male, DOB - 12/19/1985, UXL:[id number]  Admit date - 01/11/2019   Admitting Physician Jude Enid Baas, MD  Outpatient Primary MD for the patient is Patient, No Pcp Per   LOS - 1  Subjective: Patient states that his breathing is improved lower extremity swelling is also improved.    Review of Systems:   CONSTITUTIONAL: No documented fever. No fatigue, weakness. No weight gain, no weight loss.  EYES: No blurry or double vision.  ENT: No tinnitus. No postnasal drip. No redness of the oropharynx.  RESPIRATORY: No cough, no wheeze, no hemoptysis. No dyspnea.  CARDIOVASCULAR: No chest pain. No orthopnea. No palpitations. No syncope.  GASTROINTESTINAL: No nausea, no vomiting or diarrhea. No abdominal pain. No melena or hematochezia.  GENITOURINARY: No dysuria or hematuria.  ENDOCRINE: No polyuria or nocturia. No heat or cold intolerance.  HEMATOLOGY: No anemia. No bruising. No bleeding.  INTEGUMENTARY: Right lower extremity redness MUSCULOSKELETAL: No arthritis. No swelling. No gout.  NEUROLOGIC: No numbness, tingling, or ataxia. No seizure-type activity.  PSYCHIATRIC: No anxiety. No insomnia. No ADD.    Vitals:   Vitals:   01/12/19 0545 01/12/19 0845 01/12/19 0849 01/12/19 1221  BP:  (!) 146/93  139/88  Pulse: 80 99 94 97  Resp:  20  17  Temp:  97.7 F (36.5 C)  97.7 F (36.5 C)  TempSrc:  Oral  Oral  SpO2: 95% 98% 95% 96%  Weight:      Height:        Wt Readings from Last 3 Encounters:  01/11/19 117 kg  03/06/18 104.3 kg  01/13/18 103.4 kg     Intake/Output Summary (Last 24 hours) at 01/12/2019 1340 Last data filed at 01/12/2019 0500 Gross per 24 hour  Intake 3219.49 ml  Output 1550 ml  Net  1669.49 ml    Physical Exam:   GENERAL: Pleasant-appearing in no apparent distress.  HEAD, EYES, EARS, NOSE AND THROAT: Atraumatic, normocephalic. Extraocular muscles are intact. Pupils equal and reactive to light. Sclerae anicteric. No conjunctival injection. No oro-pharyngeal erythema.  NECK: Supple. There is no jugular venous distention. No bruits, no lymphadenopathy, no thyromegaly.  HEART: Regular rate and rhythm,. No murmurs, no rubs, no clicks.  LUNGS: Clear to auscultation bilaterally. No rales or rhonchi. No wheezes.  ABDOMEN: Soft, flat, nontender, nondistended. Has good bowel sounds. No hepatosplenomegaly appreciated.  EXTREMITIES: No evidence  of any cyanosis, clubbing, or peripheral edema.  +2 pedal and radial pulses bilaterally.  NEUROLOGIC: The patient is alert, awake, and oriented x3 with no focal motor or sensory deficits appreciated bilaterally.  SKIN: Right lower extremity redness is improved Psych: Not anxious, depressed LN: No inguinal LN enlargement    Antibiotics   Anti-infectives (From admission, onward)   Start     Dose/Rate Route Frequency Ordered Stop   01/12/19 1400  vancomycin (VANCOCIN) 1,250 mg in sodium chloride 0.9 % 250 mL IVPB     1,250 mg 166.7 mL/hr over 90 Minutes Intravenous Every 12 hours 01/12/19 1133     01/12/19 0200  vancomycin (VANCOCIN) IVPB 1000 mg/200 mL premix  Status:  Discontinued     1,000 mg 200 mL/hr over 60 Minutes Intravenous Every 12 hours 01/11/19 1233 01/12/19 1133   01/11/19 2200  meropenem (MERREM) 1 g in sodium chloride 0.9 % 100 mL IVPB     1 g 200 mL/hr over 30 Minutes Intravenous Every 8 hours 01/11/19 1233     01/11/19 1215  vancomycin (VANCOCIN) 1,250 mg in sodium chloride 0.9 % 250 mL IVPB     1,250 mg 166.7 mL/hr over 90 Minutes Intravenous  Once 01/11/19 1213 01/11/19 1442   01/11/19 1200  meropenem (MERREM) 1 g in sodium chloride 0.9 % 100 mL IVPB     1 g 200 mL/hr over 30 Minutes Intravenous  Once 01/11/19 1145  01/11/19 1326   01/11/19 1115  vancomycin (VANCOCIN) IVPB 1000 mg/200 mL premix     1,000 mg 200 mL/hr over 60 Minutes Intravenous  Once 01/11/19 1110 01/11/19 1227   01/11/19 0815  azithromycin (ZITHROMAX) 500 mg in sodium chloride 0.9 % 250 mL IVPB     500 mg 250 mL/hr over 60 Minutes Intravenous  Once 01/11/19 0811 01/11/19 1104   01/11/19 0815  clindamycin (CLEOCIN) IVPB 600 mg     600 mg 100 mL/hr over 30 Minutes Intravenous  Once 01/11/19 0811 01/11/19 0908      Medications   Scheduled Meds: . enoxaparin (LOVENOX) injection  40 mg Subcutaneous BID  . influenza vac split quadrivalent PF  0.5 mL Intramuscular Tomorrow-1000   Continuous Infusions: . meropenem (MERREM) IV 1 g (01/12/19 0551)  . vancomycin     PRN Meds:.acetaminophen   Data Review:   Micro Results Recent Results (from the past 240 hour(s))  SARS CORONAVIRUS 2 (TAT 6-24 HRS) Nasopharyngeal Nasopharyngeal Swab     Status: None   Collection Time: 01/11/19  8:13 AM   Specimen: Nasopharyngeal Swab  Result Value Ref Range Status   SARS Coronavirus 2 NEGATIVE NEGATIVE Final    Comment: (NOTE) SARS-CoV-2 target nucleic acids are NOT DETECTED. The SARS-CoV-2 RNA is generally detectable in upper and lower respiratory specimens during the acute phase of infection. Negative results do not preclude SARS-CoV-2 infection, do not rule out co-infections with other pathogens, and should not be used as the sole basis for treatment or other patient management decisions. Negative results must be combined with clinical observations, patient history, and epidemiological information. The expected result is Negative. Fact Sheet for Patients: HairSlick.no Fact Sheet for Healthcare Providers: quierodirigir.com This test is not yet approved or cleared by the [place] FDA and  has been authorized for detection and/or diagnosis of SARS-CoV-2 by FDA under an Emergency Use  Authorization (EUA). This EUA will remain  in effect (meaning this test can be used) for the duration of the COVID-19 declaration under Section 56 4(b)(1)  of the Act, 21 U.S.C. section 360bbb-3(b)(1), unless the authorization is terminated or revoked sooner. Performed at [facility], [place] [address] [place], [place] [address]   Blood Culture (routine x 2)     Status: None (Preliminary result)   Collection Time: 01/11/19  8:13 AM   Specimen: BLOOD  Result Value Ref Range Status   Specimen Description BLOOD BLOOD RIGHT HAND  Final   Special Requests   Final    BOTTLES DRAWN AEROBIC AND ANAEROBIC Blood Culture results may not be optimal due to an inadequate volume of blood received in culture bottles   Culture   Final    NO GROWTH < 24 HOURS Performed at [facility], [address], [place], [place] [address]    Report Status PENDING  Incomplete  Blood Culture (routine x 2)     Status: None (Preliminary result)   Collection Time: 01/11/19  8:14 AM   Specimen: BLOOD  Result Value Ref Range Status   Specimen Description BLOOD BLOOD RIGHT FOREARM  Final   Special Requests   Final    BOTTLES DRAWN AEROBIC AND ANAEROBIC Blood Culture adequate volume   Culture   Final    NO GROWTH < 24 HOURS Performed at [facility], [address], [place], [place] [address]    Report Status PENDING  Incomplete  Urine culture     Status: None   Collection Time: 01/11/19  8:14 AM   Specimen: Urine, Random  Result Value Ref Range Status   Specimen Description   Final    URINE, RANDOM Performed at [facility], [address] [place], [place] [address]    Special Requests   Final    NONE Performed at [facility], [address] [place], [place] [address]    Culture   Final    NO GROWTH Performed at [facility], [place] [address] [place], [place] [address]    Report Status 01/12/2019 FINAL  Final    Radiology Reports US Venous Img Lower  Unilateral Right  Result Date: 01/11/2019 CLINICAL DATA:  Right lower extremity pain, edema and erythema. EXAM: RIGHT LOWER EXTREMITY VENOUS DOPPLER ULTRASOUND TECHNIQUE: Gray-scale sonography with graded compression, as well as color Doppler and duplex ultrasound were performed to evaluate the lower extremity deep venous systems from the level of the common femoral vein and including the common femoral, femoral, profunda femoral, popliteal and calf veins including the posterior tibial, peroneal and gastrocnemius veins when visible. The superficial great saphenous vein was also interrogated. Spectral Doppler was utilized to evaluate flow at rest and with distal augmentation maneuvers in the common femoral, femoral and popliteal veins. COMPARISON:  None. FINDINGS: Contralateral Common Femoral Vein: Respiratory phasicity is normal and symmetric with the symptomatic side. No evidence of thrombus. Normal compressibility. Common Femoral Vein: No evidence of thrombus. Normal compressibility, respiratory phasicity and response to augmentation. Saphenofemoral Junction: No evidence of thrombus. Normal compressibility and flow on color Doppler imaging. Profunda Femoral Vein: No evidence of thrombus. Normal compressibility and flow on color Doppler imaging. Femoral Vein: No evidence of thrombus. Normal compressibility, respiratory phasicity and response to augmentation. Popliteal Vein: No evidence of thrombus. Normal compressibility, respiratory phasicity and response to augmentation. Calf Veins: No evidence of thrombus. Normal compressibility and flow on color Doppler imaging. Superficial Great Saphenous Vein: No evidence of thrombus. Normal compressibility. Venous Reflux:  None. Other Findings: No evidence of superficial thrombophlebitis or abnormal fluid collection. IMPRESSION: No evidence of right  lower extremity deep venous thrombosis. Electronically Signed   By: Irish LackGlenn  Yamagata M.D.   On: 01/11/2019 14:32   Dg Chest  Port 1 View  Result Date: 01/11/2019 CLINICAL DATA:  Cough and shortness of breath. EXAM: PORTABLE CHEST 1 VIEW COMPARISON:  None. FINDINGS: Normal cardiac silhouette and mediastinal contours given slightly reduced lung volumes. There is diffuse slightly nodular thickening of the pulmonary interstitium. No discrete focal airspace opacities. No pleural effusion or pneumothorax. No evidence of edema. No acute osseous abnormalities. IMPRESSION: Diffuse slightly nodular thickening of the pulmonary interstitium as could be seen in the setting airways disease/bronchitis, though note, atypical infection could have a similar appearance. Clinical correlation is advised. Electronically Signed   By: Simonne ComeJohn  Watts M.D.   On: 01/11/2019 08:32     CBC Recent Labs  Lab 01/11/19 0813 01/12/19 0515  WBC 15.8* 11.8*  HGB 13.6 12.8*  HCT 42.1 41.0  PLT 247 231  MCV 83.5 86.1  MCH 27.0 26.9  MCHC 32.3 31.2  RDW 13.5 13.6  LYMPHSABS 1.9  --   MONOABS 0.9  --   EOSABS 0.0  --   BASOSABS 0.0  --     Chemistries  Recent Labs  Lab 01/11/19 0813 01/12/19 0515  NA 134* 139  K 4.0 3.8  CL 101 109  CO2 24 26  GLUCOSE 124* 101*  BUN 12 8  CREATININE 1.10 0.72  CALCIUM 9.1 8.4*  MG  --  2.0  AST 30  --   ALT 27  --   ALKPHOS 64  --   BILITOT 0.9  --    ------------------------------------------------------------------------------------------------------------------ estimated creatinine clearance is 155.1 mL/min (by C-G formula based on SCr of 0.72 mg/dL). ------------------------------------------------------------------------------------------------------------------ No results for input(s): HGBA1C in the last 72 hours. ------------------------------------------------------------------------------------------------------------------ No results for input(s): CHOL, HDL, LDLCALC, TRIG, CHOLHDL, LDLDIRECT in the last 72  hours. ------------------------------------------------------------------------------------------------------------------ No results for input(s): TSH, T4TOTAL, T3FREE, THYROIDAB in the last 72 hours.  Invalid input(s): FREET3 ------------------------------------------------------------------------------------------------------------------ No results for input(s): VITAMINB12, FOLATE, FERRITIN, TIBC, IRON, RETICCTPCT in the last 72 hours.  Coagulation profile Recent Labs  Lab 01/11/19 0813 01/12/19 0515  INR 1.1 1.0    No results for input(s): DDIMER in the last 72 hours.  Cardiac Enzymes No results for input(s): CKMB, TROPONINI, MYOGLOBIN in the last 168 hours.  Invalid input(s): CK ------------------------------------------------------------------------------------------------------------------ Invalid input(s): POCBNP    Assessment & Plan   Patient is a 33 year old male with history of asthma being admitted with sepsis secondary to atypical pneumonia and right lower extremity cellulitis.  1.  Sepsis Secondary to atypical pneumonia and right lower extremity cellulitis Continue meropenem and vancomycin  2.  Atypical pneumonia Placed on broad-spectrum IV antibiotics as mentioned above Covid test negative   3.  Right lower extremity cellulitis Improved continue current antibiotic.  4.  History of asthma Stable  DVT prophylaxis; Lovenox      Code Status Orders  (From admission, onward)         Start     Ordered   01/11/19 1121  Full code  Continuous     01/11/19 1120        Code Status History    This patient has a current code status but no historical code status.   Advance Care Planning Activity           Consults none   DVT Prophylaxis  Lovenox  Lab Results  Component Value Date   PLT 231 01/12/2019     Time  Spent in minutes 35 minutes  Greater than 50% of time spent in care coordination and counseling patient regarding the  condition and plan of care.   Auburn Bilberry M.D on 01/12/2019 at 1:40 PM  Between 7am to 6pm - Pager - [phone]  After 6pm go to [email or web address] - Social research officer, government  [employer]   Office  [phone]

## 2019-01-13 LAB — CREATININE, SERUM
Creatinine, Ser: 0.77 mg/dL (ref 0.61–1.24)
GFR calc Af Amer: >60 mL/min (ref >60)
GFR calc non Af Amer: >60 mL/min (ref >60)

## 2019-01-13 MED ORDER — IBUPROFEN 400 MG PO TABS
400.0000 mg | ORAL_TABLET | Freq: Once | ORAL | Status: AC
  Administered 2019-01-13: 400 mg via ORAL
  Filled 2019-01-13: qty 1

## 2019-01-13 MED ORDER — IPRATROPIUM-ALBUTEROL 0.5-2.5 (3) MG/3ML IN SOLN: 3.0000 mL | Freq: Four times a day (QID) | RESPIRATORY_TRACT | 2 refills | Status: DC | PRN

## 2019-01-13 MED ORDER — AMOXICILLIN-POT CLAVULANATE 875-125 MG PO TABS: 1.0000 | ORAL_TABLET | Freq: Two times a day (BID) | ORAL | 0 refills | Status: AC

## 2019-01-13 MED ORDER — IPRATROPIUM-ALBUTEROL 20-100 MCG/ACT IN AERS: 1.0000 | INHALATION_SPRAY | Freq: Four times a day (QID) | RESPIRATORY_TRACT | 3 refills | Status: DC

## 2019-01-13 NOTE — Progress Notes (Signed)
[place] at [place] was admitted to the Hospital on 01/11/2019 and Discharged  01/13/2019 and should be excused from work/school   for 2 days starting 01/11/2019 , may return to work/school without any restrictions today on 01/13/19  Call Dustin Flock MD with questions.  Dustin Flock M.D on 01/13/2019,at 9:54 AM  [place] at [facility]  [phone]

## 2019-01-13 NOTE — Progress Notes (Signed)
MD ordered patient to be discharged home.  Discharge instructions were reviewed with the patient and he voiced understanding.  Patient instructed on making follow-up appointment.  Prescriptions sent to the patients pharmacy.  IV was removed with catheter intact.  All patients questions were answered.  Patient waiting on his ride.

## 2019-01-13 NOTE — TOC Transition Note (Addendum)
Transition of Care [facility]) - CM/SW Discharge Note   Patient Details  Name: Julian Franklin MRN: [id number] Date of Birth: 1985-04-03  [facility] CM/SW Contact:  Darrion Macaulay, Lenice Llamas Phone Number: [phone]  01/13/2019, 12:26 PM   Clinical Narrative: Clinical Social Worker (CSW) met with patient alone at bedside to discuss D/C plan. Patient was alert and oriented X4 and was sitting up in the bed. CSW introduced self and explained role of CSW department. Patient reported that he lives in [place] with his wife Margreta Journey. Per patient he has medicaid and does not have a PCP. CSW explained to patient that his PCP will be listed on the back of his medicaid card. Patient reported that his medicaid card is at home and he does not know his medicaid #. CSW contacted his wife and asked her to look for his medicaid #. Patient reported that he is independent and ready to go home today. CSW made patient aware that MD ordered a nebulizer. Patient reported that he does not have a nebulizer at home and will need one. CSW faxed nebulizer order to [facility]. Per Jeneen Rinks [facility] representative the on call # for [employer] is [phone]. CSW called [employer]'s on call # to see if nebulizer will be delivered to the hospital or patient's home.   12:55 pm: Per Gwinda Passe [employer] [facility] agency representative the nebulizer will be delivered to patient's home. Patient is aware of above and in agreement with plan. Per patient he will call [facility] tomorrow to have his medicaid # added to his chart. RN aware of above. Please reconsult if future social work needs arise. CSW signing off.    Final next level of care: Home/Self Care Barriers to Discharge: Barriers Resolved   Patient Goals and CMS Choice Patient states their goals for this hospitalization and ongoing recovery are:: To go home.      Discharge Placement                       Discharge Plan and Services In-house Referral: Clinical  Social Work              DME Arranged: [facility] DME Agency: [facility] Date DME Agency Contacted: 01/13/19 Time DME Agency Contacted: 31 Representative spoke with at DME Agency: James/ on call answering service HH Arranged: NA          Social Determinants of Health (SDOH) Interventions     Readmission Risk Interventions No flowsheet data found.

## 2019-01-13 NOTE — Discharge Summary (Signed)
[employer] [facility] [place] [facility] Julian Franklin, 34 y.o., DOB 1985-11-08, MRN [id number]. Admission date: 01/11/2019 Discharge Date 01/13/2019 Primary MD Patient, No Pcp Per Admitting Physician Jude Ojie, MD  Admission Diagnosis  Fever [R50.9] Sepsis, due to unspecified organism, unspecified whether acute organ dysfunction present [facility]) [A41.9]  Discharge Diagnosis   Active Problems: Sepsis present on admission due to atypical pneumonia and right lower extremity cellulitis Asthma      Hospital Course  Julian Franklin  is a 33 y.o. male with a known history of asthma who presented to the emergency room with complaints of 2-day history of shortness of breath and cough productive of sputum with fevers.  Also complains of swelling and redness of right lower extremity over the last 2 days.  Patient was admitted with lower extremity cellulitis and pneumonia.  Patient was treated with antibiotics.  With these treatments patient's cellulitis is improved.  Patient is feeling much better.  He states that he has to use his inhaler frequently.  For his asthma therefore he is being discharged on nebs I also recommended he follow-up with pulmonary.            Consults  None  Significant Tests:  See full reports for all details     US Venous Img Lower Unilateral Right  Result Date: 01/11/2019 CLINICAL DATA:  Right lower extremity pain, edema and erythema. EXAM: RIGHT LOWER EXTREMITY VENOUS DOPPLER ULTRASOUND TECHNIQUE: Gray-scale sonography with graded compression, as well as color Doppler and duplex ultrasound were performed to evaluate the lower extremity deep venous systems from the level of the common femoral vein and including the common femoral, femoral, profunda femoral, popliteal and calf veins including the posterior tibial, peroneal and gastrocnemius veins when visible. The superficial great saphenous vein was also interrogated. Spectral Doppler was utilized  to evaluate flow at rest and with distal augmentation maneuvers in the common femoral, femoral and popliteal veins. COMPARISON:  None. FINDINGS: Contralateral Common Femoral Vein: Respiratory phasicity is normal and symmetric with the symptomatic side. No evidence of thrombus. Normal compressibility. Common Femoral Vein: No evidence of thrombus. Normal compressibility, respiratory phasicity and response to augmentation. Saphenofemoral Junction: No evidence of thrombus. Normal compressibility and flow on color Doppler imaging. Profunda Femoral Vein: No evidence of thrombus. Normal compressibility and flow on color Doppler imaging. Femoral Vein: No evidence of thrombus. Normal compressibility, respiratory phasicity and response to augmentation. Popliteal Vein: No evidence of thrombus. Normal compressibility, respiratory phasicity and response to augmentation. Calf Veins: No evidence of thrombus. Normal compressibility and flow on color Doppler imaging. Superficial Great Saphenous Vein: No evidence of thrombus. Normal compressibility. Venous Reflux:  None. Other Findings: No evidence of superficial thrombophlebitis or abnormal fluid collection. IMPRESSION: No evidence of right lower extremity deep venous thrombosis. Electronically Signed   By: Irish Lack M.D.   On: 01/11/2019 14:32   Dg Chest Port 1 View  Result Date: 01/11/2019 CLINICAL DATA:  Cough and shortness of breath. EXAM: PORTABLE CHEST 1 VIEW COMPARISON:  None. FINDINGS: Normal cardiac silhouette and mediastinal contours given slightly reduced lung volumes. There is diffuse slightly nodular thickening of the pulmonary interstitium. No discrete focal airspace opacities. No pleural effusion or pneumothorax. No evidence of edema. No acute osseous abnormalities. IMPRESSION: Diffuse slightly nodular thickening of the pulmonary interstitium as could be seen in the setting airways disease/bronchitis, though note, atypical infection could have a similar  appearance. Clinical correlation is advised. Electronically Signed   By: Simonne Come  M.D.   On: 01/11/2019 08:32       Today   Subjective:   Julian Franklin patient doing well denies any complaint Objective:   Blood pressure (!) 117/96, pulse 95, temperature 97.6 F (36.4 C), temperature source Oral, resp. rate 20, height 5\' 6"  (1.676 m), weight 117 kg, SpO2 98 %.  .  Intake/Output Summary (Last 24 hours) at 01/13/2019 1237 Last data filed at 01/13/2019 0520 Gross per 24 hour  Intake 317.25 ml  Output 0 ml  Net 317.25 ml    Exam VITAL SIGNS: Blood pressure (!) 117/96, pulse 95, temperature 97.6 F (36.4 C), temperature source Oral, resp. rate 20, height 5\' 6"  (1.676 m), weight 117 kg, SpO2 98 %.  GENERAL:  33 y.o.-year-old patient lying in the bed with no acute distress.  EYES: Pupils equal, round, reactive to light and accommodation. No scleral icterus. Extraocular muscles intact.  HEENT: Head atraumatic, normocephalic. Oropharynx and nasopharynx clear.  NECK:  Supple, no jugular venous distention. No thyroid enlargement, no tenderness.  LUNGS: Normal breath sounds bilaterally, no wheezing, rales,rhonchi or crepitation. No use of accessory muscles of respiration.  CARDIOVASCULAR: S1, S2 normal. No murmurs, rubs, or gallops.  ABDOMEN: Soft, nontender, nondistended. Bowel sounds present. No organomegaly or mass.  EXTREMITIES: No pedal edema, cyanosis, or clubbing.  NEUROLOGIC: Cranial nerves II through XII are intact. Muscle strength 5/5 in all extremities. Sensation intact. Gait not checked.  PSYCHIATRIC: The patient is alert and oriented x 3.  SKIN: No obvious rash, lesion, or ulcer.   Data Review     CBC w Diff:  Lab Results  Component Value Date   WBC 11.8 (H) 01/12/2019   HGB 12.8 (L) 01/12/2019   HCT 41.0 01/12/2019   PLT 231 01/12/2019   LYMPHOPCT 12 01/11/2019   MONOPCT 5 01/11/2019   EOSPCT 0 01/11/2019   BASOPCT 0 01/11/2019   CMP:  Lab Results   Component Value Date   NA 139 01/12/2019   K 3.8 01/12/2019   CL 109 01/12/2019   CO2 26 01/12/2019   BUN 8 01/12/2019   CREATININE 0.77 01/13/2019   PROT 7.7 01/11/2019   ALBUMIN 4.2 01/11/2019   BILITOT 0.9 01/11/2019   ALKPHOS 64 01/11/2019   AST 30 01/11/2019   ALT 27 01/11/2019  .  Micro Results Recent Results (from the past 240 hour(s))  SARS CORONAVIRUS 2 (TAT 6-24 HRS) Nasopharyngeal Nasopharyngeal Swab     Status: None   Collection Time: 01/11/19  8:13 AM   Specimen: Nasopharyngeal Swab  Result Value Ref Range Status   SARS Coronavirus 2 NEGATIVE NEGATIVE Final    Comment: (NOTE) SARS-CoV-2 target nucleic acids are NOT DETECTED. The SARS-CoV-2 RNA is generally detectable in upper and lower respiratory specimens during the acute phase of infection. Negative results do not preclude SARS-CoV-2 infection, do not rule out co-infections with other pathogens, and should not be used as the sole basis for treatment or other patient management decisions. Negative results must be combined with clinical observations, patient history, and epidemiological information. The expected result is Negative. Fact Sheet for Patients: 01/13/2019 Fact Sheet for Healthcare Providers: 01/13/19 This test is not yet approved or cleared by the [place] FDA and  has been authorized for detection and/or diagnosis of SARS-CoV-2 by FDA under an Emergency Use Authorization (EUA). This EUA will remain  in effect (meaning this test can be used) for the duration of the COVID-19 declaration under Section 56 4(b)(1) of the Act, 21 U.S.C. section  360bbb-3(b)(1), unless the authorization is terminated or revoked sooner. Performed at [facility], [address] [place], [place] [address]   Blood Culture (routine x 2)     Status: None (Preliminary result)   Collection Time: 01/11/19  8:13 AM   Specimen: BLOOD  Result Value  Ref Range Status   Specimen Description BLOOD BLOOD RIGHT HAND  Final   Special Requests   Final    BOTTLES DRAWN AEROBIC AND ANAEROBIC Blood Culture results may not be optimal due to an inadequate volume of blood received in culture bottles   Culture   Final    NO GROWTH 2 DAYS Performed at [facility], [address] [place], [place] [address]    Report Status PENDING  Incomplete  Blood Culture (routine x 2)     Status: None (Preliminary result)   Collection Time: 01/11/19  8:14 AM   Specimen: BLOOD  Result Value Ref Range Status   Specimen Description BLOOD BLOOD RIGHT FOREARM  Final   Special Requests   Final    BOTTLES DRAWN AEROBIC AND ANAEROBIC Blood Culture adequate volume   Culture   Final    NO GROWTH 2 DAYS Performed at [facility], [address] [place], [place] [address]    Report Status PENDING  Incomplete  Urine culture     Status: None   Collection Time: 01/11/19  8:14 AM   Specimen: Urine, Random  Result Value Ref Range Status   Specimen Description   Final    URINE, RANDOM Performed at [facility], [address] [place], [place] [address]    Special Requests   Final    NONE Performed at [facility], [address] [place], [place] [address]    Culture   Final    NO GROWTH Performed at [facility], [address] [place], [place] [address]    Report Status 01/12/2019 FINAL  Final        Code Status Orders  (From admission, onward)         Start     Ordered   01/11/19 1121  Full code  Continuous     01/11/19 1120        Code Status History    This patient has a current code status but no historical code status.   Advance Care Planning Activity          Follow-up Information    Center, [facility]. Schedule an appointment as soon as possible for a visit in 1 week(s).   Specialty: General Practice Why: as new patient Contact information: [address] [place] [address] [phone]        Vida RiggerAleskerov, Fuad, MD. Schedule an appointment as soon as possible for a visit in 2 week(s).   Specialty: Pulmonary Disease Why: asthma not controlled Contact information: [address] [place] [address] [phone]           Discharge Medications   Allergies as of 01/13/2019      Reactions   Ceclor [cefaclor] Hives      Medication List    STOP taking these medications   nabumetone 750 MG tablet Commonly known as: RELAFEN     TAKE these medications   amoxicillin-clavulanate 875-125 MG tablet Commonly known as: Augmentin Take 1 tablet by mouth 2 (two) times daily for 7 days.   Ipratropium-Albuterol 20-100 MCG/ACT Aers respimat Commonly known as: COMBIVENT Inhale 1  puff into the lungs every 6 (six) hours.   ipratropium-albuterol 0.5-2.5 (3) MG/3ML Soln Commonly known as: DUONEB Take 3 mLs by nebulization every 6 (six) hours as needed.            Durable Medical Equipment  (From admission, onward)         Start     Ordered   01/13/19 0955  For home use only DME Nebulizer machine  Once    Question Answer Comment  Patient needs a nebulizer to treat with the following condition Asthma   Length of Need Lifetime      01/13/19 0955             Total Time in preparing paper work, data evaluation and todays exam - 5 minutes  Dustin Flock M.D on 01/13/2019 at 12:37 PM [place]  [phone]

## 2019-01-16 LAB — CULTURE, BLOOD (ROUTINE X 2)
Culture: NO GROWTH
Culture: NO GROWTH
Special Requests: ADEQUATE

## 2019-01-20 ENCOUNTER — Other Ambulatory Visit: Payer: Self-pay

## 2019-01-20 ENCOUNTER — Emergency Department
Admission: EM | Admit: 2019-01-20 | Discharge: 2019-01-20 | Disposition: A | Discharge status: Home / Self Care | Payer: Medicaid Other | Attending: Emergency Medicine | Admitting: Emergency Medicine

## 2019-01-20 ENCOUNTER — Emergency Department: Payer: Medicaid Other

## 2019-01-20 DIAGNOSIS — R2243 Localized swelling, mass and lump, lower limb, bilateral: Secondary | ICD-10-CM | POA: Insufficient documentation

## 2019-01-20 DIAGNOSIS — M7989 Other specified soft tissue disorders: Secondary | ICD-10-CM

## 2019-01-20 DIAGNOSIS — F17200 Nicotine dependence, unspecified, uncomplicated: Secondary | ICD-10-CM | POA: Diagnosis not present

## 2019-01-20 DIAGNOSIS — J45909 Unspecified asthma, uncomplicated: Secondary | ICD-10-CM | POA: Insufficient documentation

## 2019-01-20 LAB — BASIC METABOLIC PANEL
Anion gap: 11 (ref 5–15)
BUN: 15 mg/dL (ref 6–20)
CO2: 24 mmol/L (ref 22–32)
Calcium: 9.2 mg/dL (ref 8.9–10.3)
Chloride: 104 mmol/L (ref 98–111)
Creatinine, Ser: 0.83 mg/dL (ref 0.61–1.24)
GFR calc Af Amer: >60 mL/min (ref >60)
GFR calc non Af Amer: >60 mL/min (ref >60)
Glucose, Bld: 93 mg/dL (ref 70–99)
Potassium: 3.9 mmol/L (ref 3.5–5.1)
Sodium: 139 mmol/L (ref 135–145)

## 2019-01-20 LAB — CBC
HCT: 42.9 % (ref 39.0–52.0)
Hemoglobin: 13.4 g/dL (ref 13.0–17.0)
MCH: 26.7 pg (ref 26.0–34.0)
MCHC: 31.2 g/dL (ref 30.0–36.0)
MCV: 85.5 fL (ref 80.0–100.0)
Platelets: 398 10*3/uL (ref 150–400)
RBC: 5.02 MIL/uL (ref 4.22–5.81)
RDW: 13.3 % (ref 11.5–15.5)
WBC: 10.9 10*3/uL — ABNORMAL HIGH (ref 4.0–10.5)
nRBC: 0 % (ref 0.0–0.2)

## 2019-01-20 LAB — BRAIN NATRIURETIC PEPTIDE: B Natriuretic Peptide: 8 pg/mL (ref 0.0–100.0)

## 2019-01-20 MED ORDER — HYDROCHLOROTHIAZIDE 12.5 MG PO CAPS: 12.5000 mg | ORAL_CAPSULE | Freq: Every day | ORAL | 0 refills | Status: DC

## 2019-01-20 NOTE — Discharge Instructions (Signed)
Your blood work is reassuring.  There is no blood clot in your legs.  Please continue your antibiotics.  I am starting you on a low dose and a short course of hydrochlorothiazide for your swelling.  Please be sure to establish with primary care for followup and recheck.

## 2019-01-20 NOTE — ED Triage Notes (Signed)
Pt presents via POV c/o bilateral leg swelling right > left. Reports d/c after staying in hospital with sepsis x1 week ago. Reports rash to RLE has been present since in hospital. Ambulatory to triage.

## 2019-01-20 NOTE — ED Provider Notes (Signed)
[facility] Emergency Department Provider Note  ____________________________________________  Time seen: Approximately 1:01 PM  I have reviewed the triage vital signs and the nursing notes.   HISTORY  Chief Complaint Leg Swelling    HPI Julian Franklin is a 33 y.o. male presents to emergency department for evaluation of right leg swelling on and off for 1 week.  Patient states that he was admitted to the hospital last week for pneumonia and cellulitis.  Area of cellulitis was marked with a permanent marker to his right leg.  Cellulitis has not extended outside of this mark and actually looks improved.  He states that his leg swelling however has seemed to have gotten worse.  Both legs are swelling but right leg is swelling worse.  Area is sore but not really painful.  This happened a couple of months ago and was put on a fluid pill.  He never followed up with primary care and discontinued medication when swelling improved.  No fever, shortness breath, chest pain, cough.   Past Medical History:  Diagnosis Date  . Asthma     Patient Active Problem List   Diagnosis Date Noted  . Sepsis (HCC) 01/11/2019    Past Surgical History:  Procedure Laterality Date  . APPENDECTOMY    . INNER EAR SURGERY      Prior to Admission medications   Medication Sig Start Date End Date Taking? Authorizing Provider  amoxicillin-clavulanate (AUGMENTIN) 875-125 MG tablet Take 1 tablet by mouth 2 (two) times daily for 7 days. 01/13/19 01/20/19  Auburn Bilberry, MD  Ipratropium-Albuterol (COMBIVENT) 20-100 MCG/ACT AERS respimat Inhale 1 puff into the lungs every 6 (six) hours. 01/13/19   Auburn Bilberry, MD  ipratropium-albuterol (DUONEB) 0.5-2.5 (3) MG/3ML SOLN Take 3 mLs by nebulization every 6 (six) hours as needed. 01/13/19   Auburn Bilberry, MD    Allergies Ceclor [cefaclor]  No family history on file.  Social History Social History   Tobacco Use  . Smoking  status: Current Every Day Smoker  . Smokeless tobacco: Never Used  Substance Use Topics  . Alcohol use: No  . Drug use: No     Review of Systems  Constitutional: No fever/chills ENT: No upper respiratory complaints. Cardiovascular: No chest pain. Respiratory: No cough. No SOB. Gastrointestinal: No abdominal pain.  No nausea, no vomiting.  Musculoskeletal: Positive for leg pain. Skin: Negative for abrasions, lacerations, ecchymosis.  Positive for improving rash. Neurological: Negative for headaches   ____________________________________________   PHYSICAL EXAM:  VITAL SIGNS: ED Triage Vitals  Enc Vitals Group     BP 01/20/19 1143 (!) 160/93     Pulse Rate 01/20/19 1143 64     Resp 01/20/19 1143 14     Temp 01/20/19 1143 98.4 F (36.9 C)     Temp Source 01/20/19 1143 Oral     SpO2 01/20/19 1143 94 %     Weight --      Height --      Head Circumference --      Peak Flow --      Pain Score 01/20/19 1144 0     Pain Loc --      Pain Edu? --      Excl. in GC? --      Constitutional: Alert and oriented. Well appearing and in no acute distress. Eyes: Conjunctivae are normal. PERRL. EOMI. Head: Atraumatic. ENT:      Ears:      Nose: No congestion/rhinnorhea.  Mouth/Throat: Mucous membranes are moist.  Neck: No stridor.   Cardiovascular: Normal rate, regular rhythm.  Good peripheral circulation. Respiratory: Normal respiratory effort without tachypnea or retractions. Lungs CTAB. Good air entry to the bases with no decreased or absent breath sounds. Gastrointestinal: Bowel sounds 4 quadrants. Soft and nontender to palpation. No guarding or rigidity. No palpable masses. No distention.  Musculoskeletal: Full range of motion to all extremities. No gross deformities appreciated.  Area of erythematous skin to right shin with skin marked outside of area of erythema.  Erythema does not extend outside of the permanent marker.  1+ nonpitting edema to bilateral lower  extremities. Non tender. Neurologic:  Normal speech and language. No gross focal neurologic deficits are appreciated.  Skin:  Skin is warm, dry and intact. No rash noted. Psychiatric: Mood and affect are normal. Speech and behavior are normal. Patient exhibits appropriate insight and judgement.   ____________________________________________   LABS (all labs ordered are listed, but only abnormal results are displayed)  Labs Reviewed  CBC - Abnormal; Notable for the following components:      Result Value   WBC 10.9 (*)    All other components within normal limits  BASIC METABOLIC PANEL  BRAIN NATRIURETIC PEPTIDE   ____________________________________________  EKG   ____________________________________________  RADIOLOGY   No results found.  ____________________________________________    PROCEDURES  Procedure(s) performed:    Procedures    Medications - No data to display   ____________________________________________   INITIAL IMPRESSION / ASSESSMENT AND PLAN / ED COURSE  Pertinent labs & imaging results that were available during my care of the patient were reviewed by me and considered in my medical decision making (see chart for details).  Review of the [place] CSRS was performed in accordance of the [place] prior to dispensing any controlled drugs.   Patient presented to emergency department for evaluation of bilateral leg swelling intermittently.  Vital signs and exam are reassuring.  Lab work largely unremarkable.  Ultrasound negative for DVT.  Cellulitis is improving and patient is continuing his antibiotics.  Patient will be discharged home with prescriptions for a low dose and a short course of hydrochlorothiazide.  Dr. Jacqualine Code has also evaluated the patient and is in agreement with plan of care.  Patient is to follow up with primary care as directed.  Referrals were given.  Patient is given ED precautions to return to the ED for any worsening or new  symptoms.  Julian Franklin was evaluated in Emergency Department on 01/20/2019 for the symptoms described in the history of present illness. He was evaluated in the context of the global COVID-19 pandemic, which necessitated consideration that the patient might be at risk for infection with the SARS-CoV-2 virus that causes COVID-19. Institutional protocols and algorithms that pertain to the evaluation of patients at risk for COVID-19 are in a state of rapid change based on information released by regulatory bodies including the CDC and federal and state organizations. These policies and algorithms were followed during the patient's care in the ED.   ____________________________________________  FINAL CLINICAL IMPRESSION(S) / ED DIAGNOSES  Final diagnoses:  None      NEW MEDICATIONS STARTED DURING THIS VISIT:  ED Discharge Orders    None          This chart was dictated using voice recognition software/Dragon. Despite best efforts to proofread, errors can occur which can change the meaning. Any change was purely unintentional.    Laban Emperor, PA-C 01/20/19 1648  Sharyn CreamerQuale, Mark, MD 01/21/19 1414

## 2019-01-20 NOTE — ED Notes (Signed)
Patient transported to Ultrasound 

## 2019-01-20 NOTE — ED Provider Notes (Signed)
Medical screening examination/treatment/procedure(s) were conducted as a shared visit with non-physician practitioner(s) and myself.  I personally evaluated the patient during the encounter.     Delman Kitten, MD 01/20/19 [phone]

## 2019-01-20 NOTE — ED Triage Notes (Signed)
FIRST NURSE NOTE:  Pt reports recent admission, reports right leg swollen now.

## 2020-01-18 ENCOUNTER — Emergency Department
Admission: EM | Admit: 2020-01-18 | Discharge: 2020-01-18 | Disposition: A | Discharge status: Home / Self Care | Payer: Self-pay | Attending: Emergency Medicine | Admitting: Emergency Medicine

## 2020-01-18 ENCOUNTER — Emergency Department: Payer: Self-pay

## 2020-01-18 ENCOUNTER — Other Ambulatory Visit: Payer: Self-pay

## 2020-01-18 DIAGNOSIS — Z20822 Contact with and (suspected) exposure to covid-19: Secondary | ICD-10-CM | POA: Insufficient documentation

## 2020-01-18 DIAGNOSIS — Z87891 Personal history of nicotine dependence: Secondary | ICD-10-CM | POA: Insufficient documentation

## 2020-01-18 DIAGNOSIS — J45901 Unspecified asthma with (acute) exacerbation: Secondary | ICD-10-CM

## 2020-01-18 DIAGNOSIS — J45909 Unspecified asthma, uncomplicated: Secondary | ICD-10-CM | POA: Insufficient documentation

## 2020-01-18 DIAGNOSIS — J209 Acute bronchitis, unspecified: Secondary | ICD-10-CM | POA: Insufficient documentation

## 2020-01-18 LAB — RESPIRATORY PANEL BY RT PCR (FLU A&B, COVID)
Influenza A by PCR: NEGATIVE
Influenza B by PCR: NEGATIVE
SARS Coronavirus 2 by RT PCR: NEGATIVE

## 2020-01-18 MED ORDER — IPRATROPIUM-ALBUTEROL 0.5-2.5 (3) MG/3ML IN SOLN
3.0000 mL | Freq: Once | RESPIRATORY_TRACT | Status: AC
  Administered 2020-01-18: 3 mL via RESPIRATORY_TRACT
  Filled 2020-01-18: qty 3

## 2020-01-18 MED ORDER — PREDNISONE 20 MG PO TABS
60.0000 mg | ORAL_TABLET | Freq: Once | ORAL | Status: AC
  Administered 2020-01-18: 60 mg via ORAL
  Filled 2020-01-18: qty 3

## 2020-01-18 MED ORDER — ACETAMINOPHEN 500 MG PO TABS
ORAL_TABLET | ORAL | Status: AC
  Filled 2020-01-18: qty 2

## 2020-01-18 MED ORDER — ALBUTEROL SULFATE (2.5 MG/3ML) 0.083% IN NEBU: 2.5000 mg | INHALATION_SOLUTION | RESPIRATORY_TRACT | 0 refills | Status: DC | PRN

## 2020-01-18 MED ORDER — AZITHROMYCIN 250 MG PO TABS: ORAL_TABLET | ORAL | 0 refills | Status: DC

## 2020-01-18 MED ORDER — ACETAMINOPHEN 500 MG PO TABS: 1000.0000 mg | ORAL_TABLET | Freq: Once | ORAL | Status: DC

## 2020-01-18 MED ORDER — PREDNISONE 20 MG PO TABS: 40.0000 mg | ORAL_TABLET | Freq: Every day | ORAL | 0 refills | Status: DC

## 2020-01-18 MED ORDER — ONDANSETRON 4 MG PO TBDP: 4.0000 mg | ORAL_TABLET | Freq: Three times a day (TID) | ORAL | 0 refills | Status: DC | PRN

## 2020-01-18 NOTE — ED Provider Notes (Signed)
[facility] Emergency Department Provider Note  ____________________________________________  Time seen: Approximately 9:28 PM  I have reviewed the triage vital signs and the nursing notes.   HISTORY  Chief Complaint Fever    HPI Latoya Diskin is a 34 y.o. male with a history of asthma who comes the ED complaining of about 1 week of nonproductive cough, chest tightness, associated with chills and runny nose.  Patient reports that he was vaccinated for Covid in May and also got sick with Covid in the same month.  Symptoms are constant, waxing and waning, no aggravating or alleviating factors.  Denies any recent travel trauma hospitalization or surgery.  Denies pleuritic chest pain.  No abdominal pain vomiting or diarrhea, no dysuria.  No skin rash.      Past Medical History:  Diagnosis Date  . Asthma      Patient Active Problem List   Diagnosis Date Noted  . Sepsis (HCC) 01/11/2019     Past Surgical History:  Procedure Laterality Date  . APPENDECTOMY    . INNER EAR SURGERY       Prior to Admission medications   Medication Sig Start Date End Date Taking? Authorizing Provider  albuterol (PROVENTIL) (2.5 MG/3ML) 0.083% nebulizer solution Take 3 mLs (2.5 mg total) by nebulization every 4 (four) hours as needed for wheezing or shortness of breath. 01/18/20   Sharman Cheek, MD  azithromycin (ZITHROMAX Z-PAK) 250 MG tablet Take 2 tablets (500 mg) on  Day 1,  followed by 1 tablet (250 mg) once daily on Days 2 through 5. 01/18/20   Sharman Cheek, MD  hydrochlorothiazide (MICROZIDE) 12.5 MG capsule Take 1 capsule (12.5 mg total) by mouth daily. 01/20/19 01/20/20  Enid Derry, PA-C  Ipratropium-Albuterol (COMBIVENT) 20-100 MCG/ACT AERS respimat Inhale 1 puff into the lungs every 6 (six) hours. 01/13/19   Auburn Bilberry, MD  ipratropium-albuterol (DUONEB) 0.5-2.5 (3) MG/3ML SOLN Take 3 mLs by nebulization every 6 (six) hours as needed.  01/13/19   Auburn Bilberry, MD  ondansetron (ZOFRAN ODT) 4 MG disintegrating tablet Take 1 tablet (4 mg total) by mouth every 8 (eight) hours as needed for nausea or vomiting. 01/18/20   Sharman Cheek, MD  predniSONE (DELTASONE) 20 MG tablet Take 2 tablets (40 mg total) by mouth daily. 01/18/20   Sharman Cheek, MD     Allergies Ceclor [cefaclor]   No family history on file.  Social History Social History   Tobacco Use  . Smoking status: Former Games developer  . Smokeless tobacco: Never Used  Substance Use Topics  . Alcohol use: No  . Drug use: No    Review of Systems  Constitutional:   No fever positive chills.  ENT:   Positive sore throat.  Positive rhinorrhea. Cardiovascular:   No chest pain or syncope. Respiratory:   Positive shortness of breath and nonproductive cough. Gastrointestinal:   Negative for abdominal pain, vomiting and diarrhea.  Musculoskeletal:   Negative for focal pain or swelling All other systems reviewed and are negative except as documented above in ROS and HPI.  ____________________________________________   PHYSICAL EXAM:  VITAL SIGNS: ED Triage Vitals  Enc Vitals Group     BP 01/18/20 1835 (!) 160/97     Pulse Rate 01/18/20 1835 (!) 116     Resp 01/18/20 1835 18     Temp 01/18/20 1835 (!) 100.5 F (38.1 C)     Temp Source 01/18/20 1835 Oral     SpO2 01/18/20 1835 92 %  Weight 01/18/20 1836 289 lb (131.1 kg)     Height 01/18/20 1836 5\' 5"  (1.651 m)     Head Circumference --      Peak Flow --      Pain Score 01/18/20 1836 2     Pain Loc --      Pain Edu? --      Excl. in GC? --     Vital signs reviewed, nursing assessments reviewed.   Constitutional:   Alert and oriented. Non-toxic appearance. Eyes:   Conjunctivae are normal. EOMI. PERRL. ENT      Head:   Normocephalic and atraumatic.      Nose:   Wearing a mask.      Mouth/Throat:   Wearing a mask.      Neck:   No meningismus. Full  ROM. Hematological/Lymphatic/Immunilogical:   No cervical lymphadenopathy. Cardiovascular:   Tachycardia heart rate 110. Symmetric bilateral radial and DP pulses.  No murmurs. Cap refill less than 2 seconds. Respiratory:   Normal respiratory effort without tachypnea/retractions.  No crackles.  Minimal expiratory wheezing which is accentuated with FEV1 maneuver and cough. Gastrointestinal:   Soft and nontender. Non distended. There is no CVA tenderness.  No rebound, rigidity, or guarding.  Musculoskeletal:   Normal range of motion in all extremities. No joint effusions.  No lower extremity tenderness.  Trace peripheral edema, chronic according to the patient, no calf tenderness.  Symmetric calf circumference.. Neurologic:   Normal speech and language.  Motor grossly intact. No acute focal neurologic deficits are appreciated.  Skin:    Skin is warm, dry and intact. No rash noted.  No petechiae, purpura, or bullae.  ____________________________________________    LABS (pertinent positives/negatives) (all labs ordered are listed, but only abnormal results are displayed) Labs Reviewed  RESPIRATORY PANEL BY RT PCR (FLU A&B, COVID)   ____________________________________________   EKG    ____________________________________________    RADIOLOGY  DG Chest Portable 1 View  Result Date: 01/18/2020 CLINICAL DATA:  Cough and fever. EXAM: PORTABLE CHEST 1 VIEW COMPARISON:  January 11, 2019 FINDINGS: The heart size and mediastinal contours are within normal limits. Both lungs are clear. The visualized skeletal structures are unremarkable. IMPRESSION: No active disease. Electronically Signed   By: January 13, 2019 M.D.   On: 01/18/2020 21:31    ____________________________________________   PROCEDURES Procedures  ____________________________________________  DIFFERENTIAL DIAGNOSIS   Acute bronchitis, viral illness/Covid, pneumonia, asthma exacerbation  CLINICAL IMPRESSION /  ASSESSMENT AND PLAN / ED COURSE  Medications ordered in the ED: Medications  acetaminophen (TYLENOL) tablet 1,000 mg (0 mg Oral Hold 01/18/20 2050)  predniSONE (DELTASONE) tablet 60 mg (60 mg Oral Given 01/18/20 2113)  ipratropium-albuterol (DUONEB) 0.5-2.5 (3) MG/3ML nebulizer solution 3 mL (3 mLs Nebulization Given 01/18/20 2113)    Pertinent labs & imaging results that were available during my care of the patient were reviewed by me and considered in my medical decision making (see chart for details).  Julian Franklin was evaluated in Emergency Department on 01/18/2020 for the symptoms described in the history of present illness. He was evaluated in the context of the global COVID-19 pandemic, which necessitated consideration that the patient might be at risk for infection with the SARS-CoV-2 virus that causes COVID-19. Institutional protocols and algorithms that pertain to the evaluation of patients at risk for COVID-19 are in a state of rapid change based on information released by regulatory bodies including the CDC and federal and state organizations. These policies and algorithms were followed  during the patient's care in the ED.   Patient presents with URI symptoms suggestive of viral infection.  With his history of asthma, will give steroids and bronchodilators, plan to cover with azithromycin.  Covid test sent. Considering the patient's symptoms, medical history, and physical examination today, I have low suspicion for ACS, PE, TAD, pneumothorax, carditis, mediastinitis, CHF, or sepsis.  I recommended we check labs and give IV fluids for hydration to ensure his tachycardia resolves, patient reports that he needs to leave the ED within an hour to get ready for work tonight.  Will defer on labs given the time constraints, though I did encourage him to quarantine at home until his Covid test is resulted.  He does not have any known high risk Covid exposure.    Clinical Course as of  Jan 18 2243  Sat Jan 18, 2020  2124 Chest x-ray image reviewed by me, appears unremarkable.  No edema effusion pneumothorax.  No obvious infiltrates.   [PS]    Clinical Course User Index [PS] Sharman Cheek, MD    ----------------------------------------- 10:44 PM on 01/18/2020 -----------------------------------------  Radiology report for chest x-ray agrees, unremarkable.  Covid test negative.   ____________________________________________   FINAL CLINICAL IMPRESSION(S) / ED DIAGNOSES    Final diagnoses:  Acute bronchitis, unspecified organism  Exacerbation of asthma, unspecified asthma severity, unspecified whether persistent     ED Discharge Orders         Ordered    azithromycin (ZITHROMAX Z-PAK) 250 MG tablet        01/18/20 2127    predniSONE (DELTASONE) 20 MG tablet  Daily        01/18/20 2127    albuterol (PROVENTIL) (2.5 MG/3ML) 0.083% nebulizer solution  Every 4 hours PRN        01/18/20 2127    ondansetron (ZOFRAN ODT) 4 MG disintegrating tablet  Every 8 hours PRN        01/18/20 2127          Portions of this note were generated with dragon dictation software. Dictation errors may occur despite best attempts at proofreading.   Sharman Cheek, MD 01/18/20 2244

## 2020-01-18 NOTE — ED Triage Notes (Signed)
Pt to the er for cough, fever, pain with coughing, runny nose. Pt had covid in May and has been vaccinated as well. Pt states symptoms have been going on 2 weeks.

## 2020-01-18 NOTE — ED Notes (Signed)
Patient to ED for two weeks of coughing and chest congestion. Last year had "double pneumonia and admitted to the hospital."

## 2020-01-18 NOTE — Discharge Instructions (Signed)
We sent a Covid test to the lab today.  We will call you if it is positive.  Please take medications as prescribed to control your symptoms and help prevent pneumonia.

## 2020-05-20 ENCOUNTER — Other Ambulatory Visit: Payer: Self-pay

## 2020-05-20 ENCOUNTER — Emergency Department
Admission: EM | Admit: 2020-05-20 | Discharge: 2020-05-20 | Disposition: A | Discharge status: Home / Self Care | Payer: No Typology Code available for payment source | Attending: Emergency Medicine | Admitting: Emergency Medicine

## 2020-05-20 ENCOUNTER — Emergency Department: Payer: No Typology Code available for payment source

## 2020-05-20 DIAGNOSIS — S39012A Strain of muscle, fascia and tendon of lower back, initial encounter: Secondary | ICD-10-CM | POA: Diagnosis not present

## 2020-05-20 DIAGNOSIS — Y92410 Unspecified street and highway as the place of occurrence of the external cause: Secondary | ICD-10-CM | POA: Diagnosis not present

## 2020-05-20 DIAGNOSIS — J45909 Unspecified asthma, uncomplicated: Secondary | ICD-10-CM | POA: Insufficient documentation

## 2020-05-20 DIAGNOSIS — S199XXA Unspecified injury of neck, initial encounter: Secondary | ICD-10-CM | POA: Diagnosis present

## 2020-05-20 DIAGNOSIS — S161XXA Strain of muscle, fascia and tendon at neck level, initial encounter: Secondary | ICD-10-CM | POA: Diagnosis not present

## 2020-05-20 DIAGNOSIS — M7918 Myalgia, other site: Secondary | ICD-10-CM

## 2020-05-20 DIAGNOSIS — Z87891 Personal history of nicotine dependence: Secondary | ICD-10-CM | POA: Insufficient documentation

## 2020-05-20 LAB — URINALYSIS, COMPLETE (UACMP) WITH MICROSCOPIC
Bacteria, UA: NONE SEEN
Bilirubin Urine: NEGATIVE
Glucose, UA: NEGATIVE mg/dL
Ketones, ur: NEGATIVE mg/dL
Leukocytes,Ua: NEGATIVE
Nitrite: NEGATIVE
Protein, ur: NEGATIVE mg/dL
Specific Gravity, Urine: 1.008 (ref 1.005–1.030)
Squamous Epithelial / HPF: NONE SEEN (ref 0–5)
pH: 7 (ref 5.0–8.0)

## 2020-05-20 MED ORDER — NAPROXEN 500 MG PO TABS: 500.0000 mg | ORAL_TABLET | Freq: Two times a day (BID) | ORAL | 2 refills | Status: DC

## 2020-05-20 MED ORDER — ORPHENADRINE CITRATE ER 100 MG PO TB12: 100.0000 mg | ORAL_TABLET | Freq: Two times a day (BID) | ORAL | 0 refills | Status: DC

## 2020-05-20 NOTE — Discharge Instructions (Signed)
No acute findings x-ray of the cervical lumbar spine.  Follow discharge care instruction take medication as directed.
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
## 2020-07-25 ENCOUNTER — Other Ambulatory Visit: Payer: Self-pay

## 2020-07-25 ENCOUNTER — Emergency Department
Admission: EM | Admit: 2020-07-25 | Discharge: 2020-07-25 | Disposition: A | Discharge status: Home / Self Care | Payer: Medicaid Other | Attending: Emergency Medicine | Admitting: Emergency Medicine

## 2020-07-25 DIAGNOSIS — J45909 Unspecified asthma, uncomplicated: Secondary | ICD-10-CM | POA: Insufficient documentation

## 2020-07-25 DIAGNOSIS — A46 Erysipelas: Secondary | ICD-10-CM | POA: Insufficient documentation

## 2020-07-25 DIAGNOSIS — Z87891 Personal history of nicotine dependence: Secondary | ICD-10-CM | POA: Insufficient documentation

## 2020-07-25 LAB — COMPREHENSIVE METABOLIC PANEL
ALT: 33 U/L (ref 0–44)
AST: 23 U/L (ref 15–41)
Albumin: 4.2 g/dL (ref 3.5–5.0)
Alkaline Phosphatase: 72 U/L (ref 38–126)
Anion gap: 9 (ref 5–15)
BUN: 15 mg/dL (ref 6–20)
CO2: 23 mmol/L (ref 22–32)
Calcium: 9.1 mg/dL (ref 8.9–10.3)
Chloride: 102 mmol/L (ref 98–111)
Creatinine, Ser: 1.04 mg/dL (ref 0.61–1.24)
GFR, Estimated: >60 mL/min (ref >60)
Glucose, Bld: 104 mg/dL — ABNORMAL HIGH (ref 70–99)
Potassium: 3.7 mmol/L (ref 3.5–5.1)
Sodium: 134 mmol/L — ABNORMAL LOW (ref 135–145)
Total Bilirubin: 1.3 mg/dL — ABNORMAL HIGH (ref 0.3–1.2)
Total Protein: 8.3 g/dL — ABNORMAL HIGH (ref 6.5–8.1)

## 2020-07-25 LAB — CBC WITH DIFFERENTIAL/PLATELET
Abs Immature Granulocytes: 0.05 10*3/uL (ref 0.00–0.07)
Basophils Absolute: 0 10*3/uL (ref 0.0–0.1)
Basophils Relative: 0 %
Eosinophils Absolute: 0.1 10*3/uL (ref 0.0–0.5)
Eosinophils Relative: 1 %
HCT: 43.6 % (ref 39.0–52.0)
Hemoglobin: 13.8 g/dL (ref 13.0–17.0)
Immature Granulocytes: 0 %
Lymphocytes Relative: 17 %
Lymphs Abs: 2 10*3/uL (ref 0.7–4.0)
MCH: 26.1 pg (ref 26.0–34.0)
MCHC: 31.7 g/dL (ref 30.0–36.0)
MCV: 82.6 fL (ref 80.0–100.0)
Monocytes Absolute: 0.8 10*3/uL (ref 0.1–1.0)
Monocytes Relative: 7 %
Neutro Abs: 8.7 10*3/uL — ABNORMAL HIGH (ref 1.7–7.7)
Neutrophils Relative %: 75 %
Platelets: 232 10*3/uL (ref 150–400)
RBC: 5.28 MIL/uL (ref 4.22–5.81)
RDW: 14.4 % (ref 11.5–15.5)
WBC: 11.6 10*3/uL — ABNORMAL HIGH (ref 4.0–10.5)
nRBC: 0 % (ref 0.0–0.2)

## 2020-07-25 LAB — BRAIN NATRIURETIC PEPTIDE: B Natriuretic Peptide: 15.5 pg/mL (ref 0.0–100.0)

## 2020-07-25 LAB — LACTIC ACID, PLASMA: Lactic Acid, Venous: 0.7 mmol/L (ref 0.5–1.9)

## 2020-07-25 MED ORDER — CEPHALEXIN 500 MG PO CAPS
500.0000 mg | ORAL_CAPSULE | Freq: Once | ORAL | Status: AC
  Administered 2020-07-25: 500 mg via ORAL
  Filled 2020-07-25: qty 1

## 2020-07-25 MED ORDER — CEPHALEXIN 500 MG PO CAPS: 500.0000 mg | ORAL_CAPSULE | Freq: Four times a day (QID) | ORAL | 0 refills | Status: AC

## 2020-07-25 MED ORDER — CEPHALEXIN 500 MG PO CAPS: 500.0000 mg | ORAL_CAPSULE | Freq: Four times a day (QID) | ORAL | 0 refills | Status: DC

## 2020-07-25 MED ORDER — DEXAMETHASONE SODIUM PHOSPHATE 10 MG/ML IJ SOLN
10.0000 mg | Freq: Once | INTRAMUSCULAR | Status: AC
  Administered 2020-07-25: 10 mg via INTRAMUSCULAR
  Filled 2020-07-25: qty 1

## 2020-07-25 NOTE — ED Notes (Signed)
Called lab to come draw bloodwork. Per lab, will send someone down

## 2020-07-25 NOTE — ED Provider Notes (Signed)
[facility] Emergency Department Provider Note ____________________________________________  Time seen: 1605  I have reviewed the triage vital signs and the nursing notes.  HISTORY  Chief Complaint  Rash  HPI Julian Franklin is a 35 y.o. male presents self to the ED for evaluation of sudden onset of left anterior leg swelling and redness.  Patient reports a similar incident that occurred about 3 years prior.  He reports of sudden onset of viral type symptoms yesterday with subjective fevers, chills, and vomiting.  He awoke today to note the anterior leg redness and swelling.  Patient denies any injury or trauma to the leg.  He notes the skin is markedly red, and tender to the anterior portion.  He denies any other injuries or complaints at this time.    Past Medical History:  Diagnosis Date  . Asthma     Patient Active Problem List   Diagnosis Date Noted  . Sepsis (HCC) 01/11/2019    Past Surgical History:  Procedure Laterality Date  . APPENDECTOMY    . INNER EAR SURGERY      Prior to Admission medications   Medication Sig Start Date End Date Taking? Authorizing Provider  acetaminophen (TYLENOL) 500 MG tablet Take 500 mg by mouth every 6 (six) hours as needed for headache.    [provider]  albuterol (PROVENTIL) (2.5 MG/3ML) 0.083% nebulizer solution Take 3 mLs (2.5 mg total) by nebulization every 4 (four) hours as needed for wheezing or shortness of breath. 01/18/20   Sharman Cheek, MD  cephALEXin (KEFLEX) 500 MG capsule Take 1 capsule (500 mg total) by mouth 4 (four) times daily for 7 days. 07/25/20 08/01/20  Menna Abeln, Charlesetta Ivory, PA-C    Allergies Ceclor [cefaclor]  History reviewed. No pertinent family history.  Social History Social History   Tobacco Use  . Smoking status: Former Games developer  . Smokeless tobacco: Never Used  Substance Use Topics  . Alcohol use: No  . Drug use: No    Review of Systems  Constitutional:  Negative for fever. Eyes: Negative for visual changes. ENT: Negative for sore throat. Cardiovascular: Negative for chest pain. Respiratory: Negative for shortness of breath. Gastrointestinal: Negative for abdominal pain, vomiting and diarrhea. Genitourinary: Negative for dysuria. Musculoskeletal: Negative for back pain. Skin: Negative for rash.  Left lower extremity with redness as above. Neurological: Negative for headaches, focal weakness or numbness. ____________________________________________  PHYSICAL EXAM:  VITAL SIGNS: ED Triage Vitals  Enc Vitals Group     BP 07/25/20 1420 128/88     Pulse Rate 07/25/20 1420 (!) 104     Resp 07/25/20 1420 18     Temp 07/25/20 1420 98.4 F (36.9 C)     Temp Source 07/25/20 1420 Oral     SpO2 07/25/20 1420 95 %     Weight 07/25/20 1421 270 lb (122.5 kg)     Height 07/25/20 1421 5\' 5"  (1.651 m)     Head Circumference --      Peak Flow --      Pain Score 07/25/20 1421 4     Pain Loc --      Pain Edu? --      Excl. in GC? --     Constitutional: Alert and oriented. Well appearing and in no distress. Head: Normocephalic and atraumatic. Eyes: Conjunctivae are normal.  Normal extraocular movements Cardiovascular: Normal rate, regular rhythm. Normal distal pulses. Respiratory: Normal respiratory effort. No wheezes/rales/rhonchi. Musculoskeletal: Nontender with normal range of motion in all  extremities.  Neurologic:  Normal gait without ataxia. Normal speech and language. No gross focal neurologic deficits are appreciated. Skin:  Skin is warm, dry and intact. No rash noted.  Cheerier left lower extremity with a well demarcated area of erythema measuring approximately 18 x 15 cm.  There is no overlying skin changes, blister, weeping, or purulence.  No pointing, fluctuance is appreciated.  The area is warm to touch.  A marker is used to outline the borders of the area. ____________________________________________   LABS (pertinent  positives/negatives) Labs Reviewed  COMPREHENSIVE METABOLIC PANEL - Abnormal; Notable for the following components:      Result Value   Sodium 134 (*)    Glucose, Bld 104 (*)    Total Protein 8.3 (*)    Total Bilirubin 1.3 (*)    All other components within normal limits  CBC WITH DIFFERENTIAL/PLATELET - Abnormal; Notable for the following components:   WBC 11.6 (*)    Neutro Abs 8.7 (*)    All other components within normal limits  BRAIN NATRIURETIC PEPTIDE  LACTIC ACID, PLASMA  ____________________________________________  PROCEDURES  Keflex 500 mg PO Decadron 10 mg PO   Procedures ____________________________________________   INITIAL IMPRESSION / ASSESSMENT AND PLAN / ED COURSE  As part of my medical decision making, I reviewed the following data within the electronic MEDICAL RECORD NUMBER Labs reviewed WNL, Old chart reviewed and Notes from prior ED visits     DDX: erysipelas, abscess, hematoma  Patient ED evaluation of acute left anterior leg redness and tenderness.  Patient presents with a well-demarcated area of erythema to the anterior leg, consistent with a nonpurulent cellulitis.  Labs are reassuring at this time.  And no signs of systemic infection.  Patient will be discharged with a prescription for Keflex to take as directed.  He will follow-up with primary provider return to the ED for acutely worsening symptoms as discussed.    Julian Franklin was evaluated in Emergency Department on 07/25/2020 for the symptoms described in the history of present illness. He was evaluated in the context of the global COVID-19 pandemic, which necessitated consideration that the patient might be at risk for infection with the SARS-CoV-2 virus that causes COVID-19. Institutional protocols and algorithms that pertain to the evaluation of patients at risk for COVID-19 are in a state of rapid change based on information released by regulatory bodies including the CDC and federal and  state organizations. These policies and algorithms were followed during the patient's care in the ED. ____________________________________________  FINAL CLINICAL IMPRESSION(S) / ED DIAGNOSES  Final diagnoses:  Erysipelas of left lower extremity      Karmen Stabs, Charlesetta Ivory, PA-C 07/25/20 2006    Chesley Noon, MD 07/25/20 2333

## 2020-07-25 NOTE — ED Triage Notes (Addendum)
Pt to ED POV for rash to left lower leg that is painful to touch. No swelling, redness noted Hx of same, unsure of dx or how treated

## 2020-12-01 IMAGING — US US EXTREM LOW VENOUS*R*
1 series · 13 of 24 positions shown · non-contrast
Comparison: None.

CLINICAL DATA: Right lower extremity pain, edema and erythema.



[Series 1: us extrem low venous*right* · 0.09mm/px · 13 of 34 slices shown]
[im 1/34]
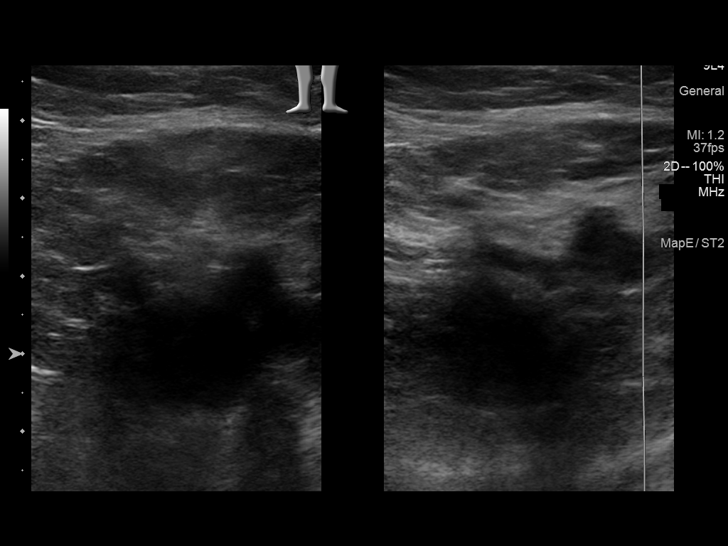
[im 3/34]
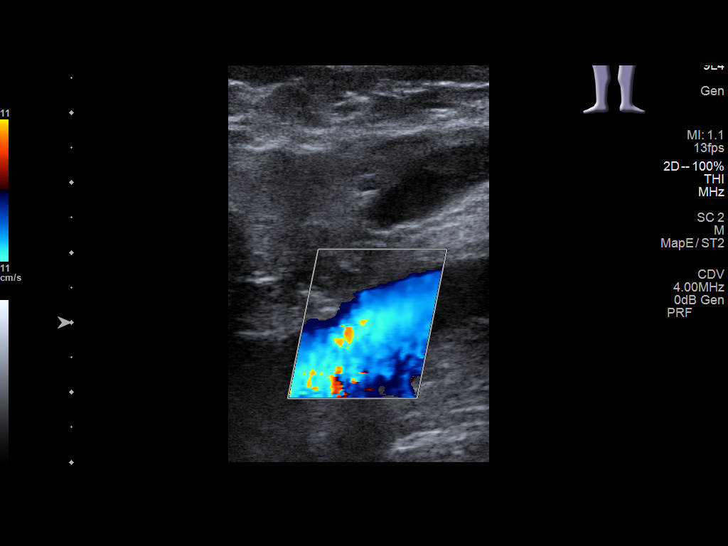
[im 6/34]
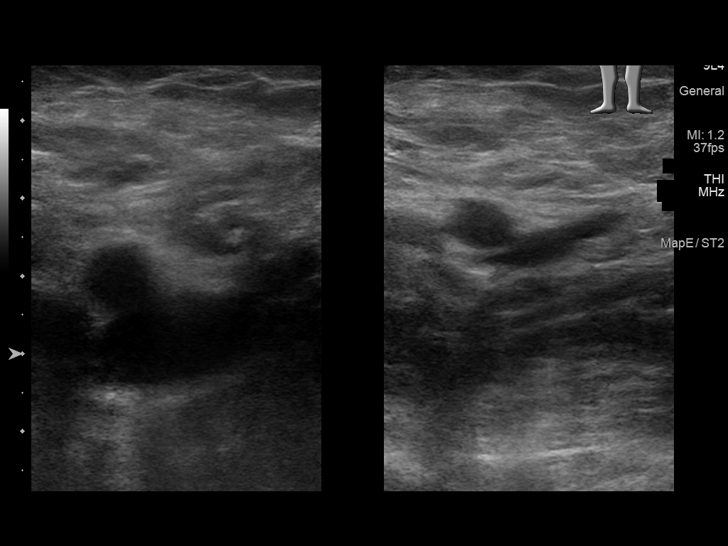
[im 9/34]
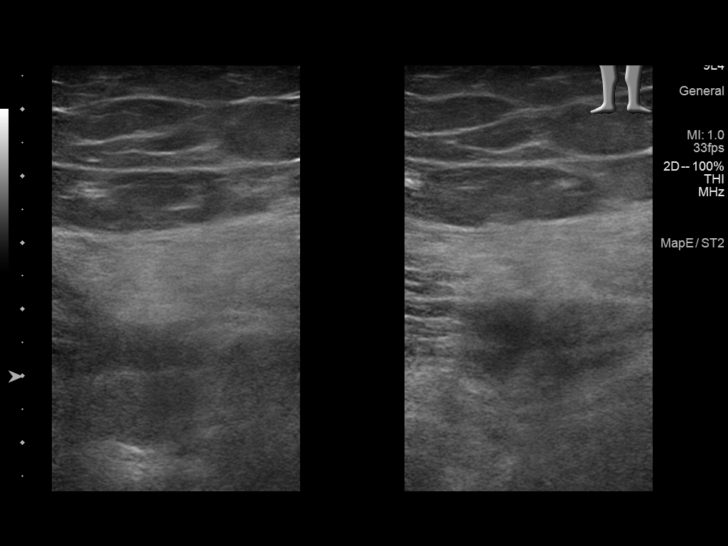
[im 12/34]
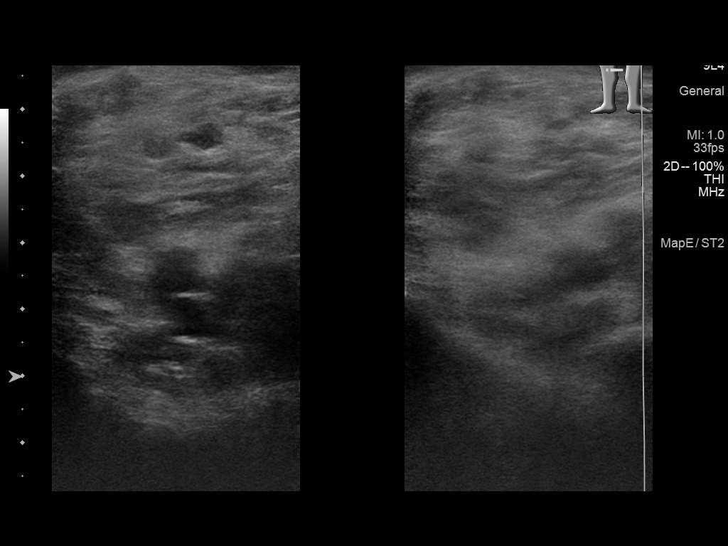
[im 15/34]
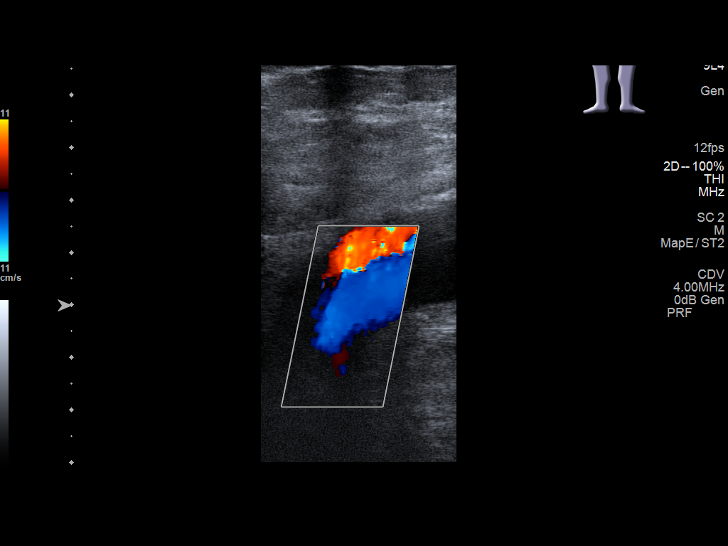
[im 18/34]
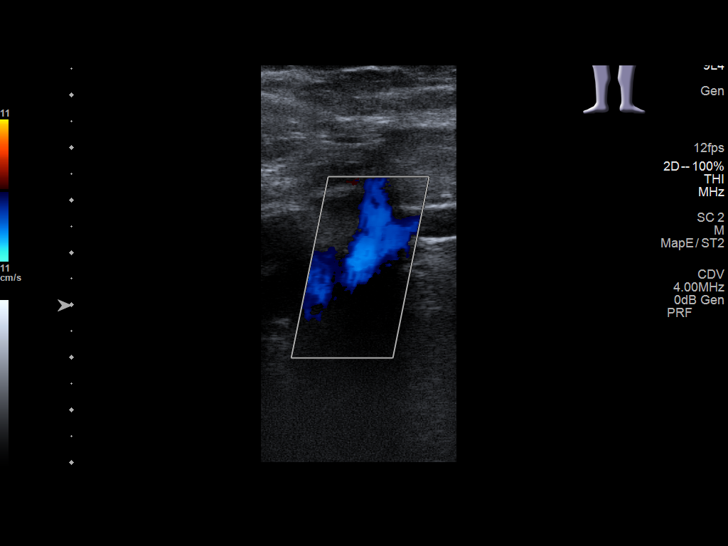
[im 19/34]
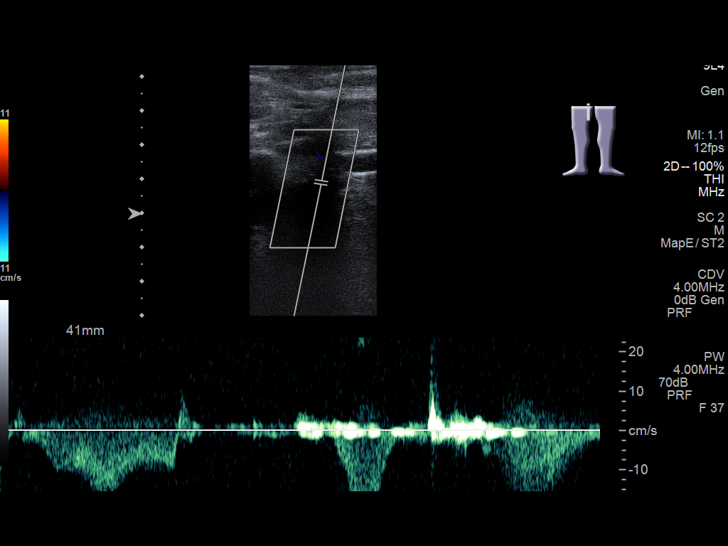
[im 22/34]
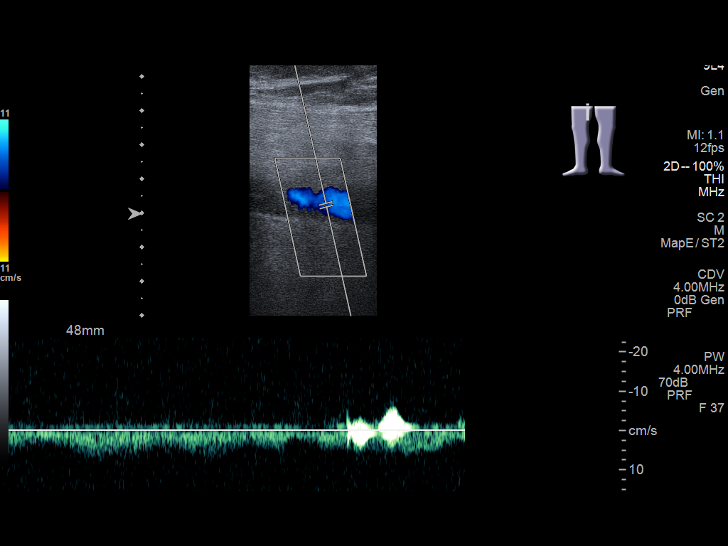
[im 25/34]
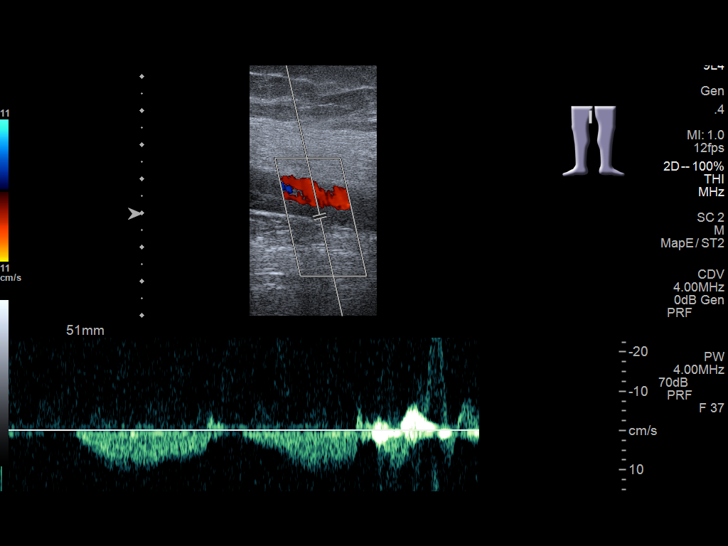
[im 28/34]
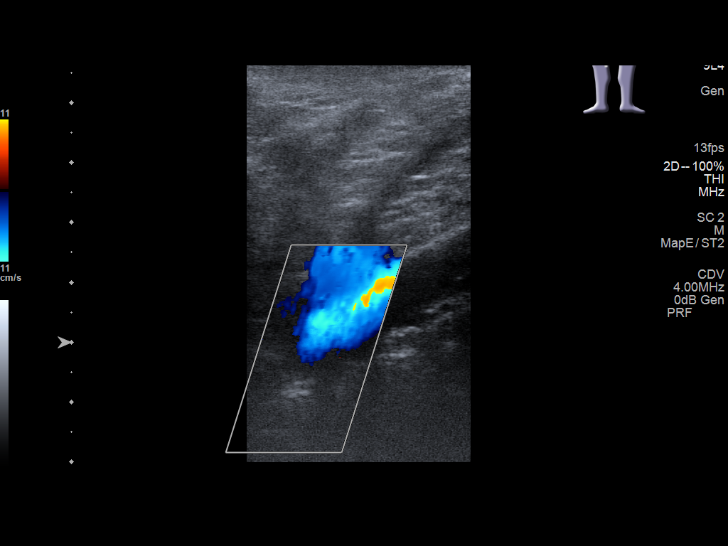
[im 31/34]
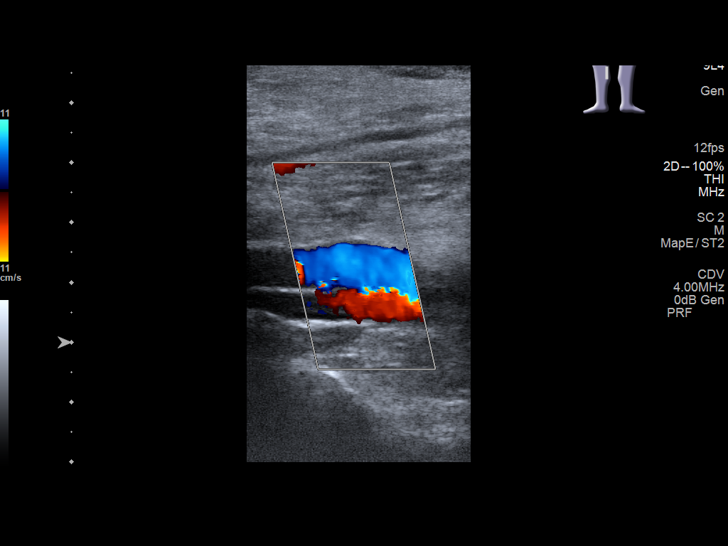
[im 34/34]
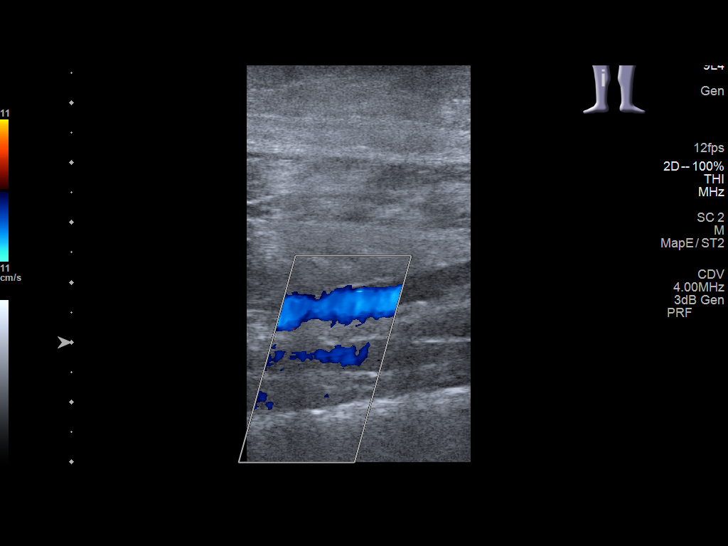

[13 of 24 positions shown; findings below may reference images not displayed]

FINDINGS: Contralateral Common Femoral Vein: Respiratory phasicity is normal
and symmetric with the symptomatic side. No evidence of thrombus.
Normal compressibility.

Common Femoral Vein: No evidence of thrombus. Normal
compressibility, respiratory phasicity and response to augmentation.

Saphenofemoral Junction: No evidence of thrombus. Normal
compressibility and flow on color Doppler imaging.

Profunda Femoral Vein: No evidence of thrombus. Normal
compressibility and flow on color Doppler imaging.

Femoral Vein: No evidence of thrombus. Normal compressibility,
respiratory phasicity and response to augmentation.

Popliteal Vein: No evidence of thrombus. Normal compressibility,
respiratory phasicity and response to augmentation.

Calf Veins: No evidence of thrombus. Normal compressibility and flow
on color Doppler imaging.

Superficial Great Saphenous Vein: No evidence of thrombus. Normal
compressibility.

Venous Reflux:  None.

Other Findings: No evidence of superficial thrombophlebitis or
abnormal fluid collection.
IMPRESSION: No evidence of right lower extremity deep venous thrombosis.

## 2020-12-01 IMAGING — DX DG CHEST 1V PORT
1 series · 1 of 1 positions shown · non-contrast
Comparison: None.

CLINICAL DATA: Cough and shortness of breath.

EXAM:
PORTABLE CHEST 1 VIEW

[chest ap]
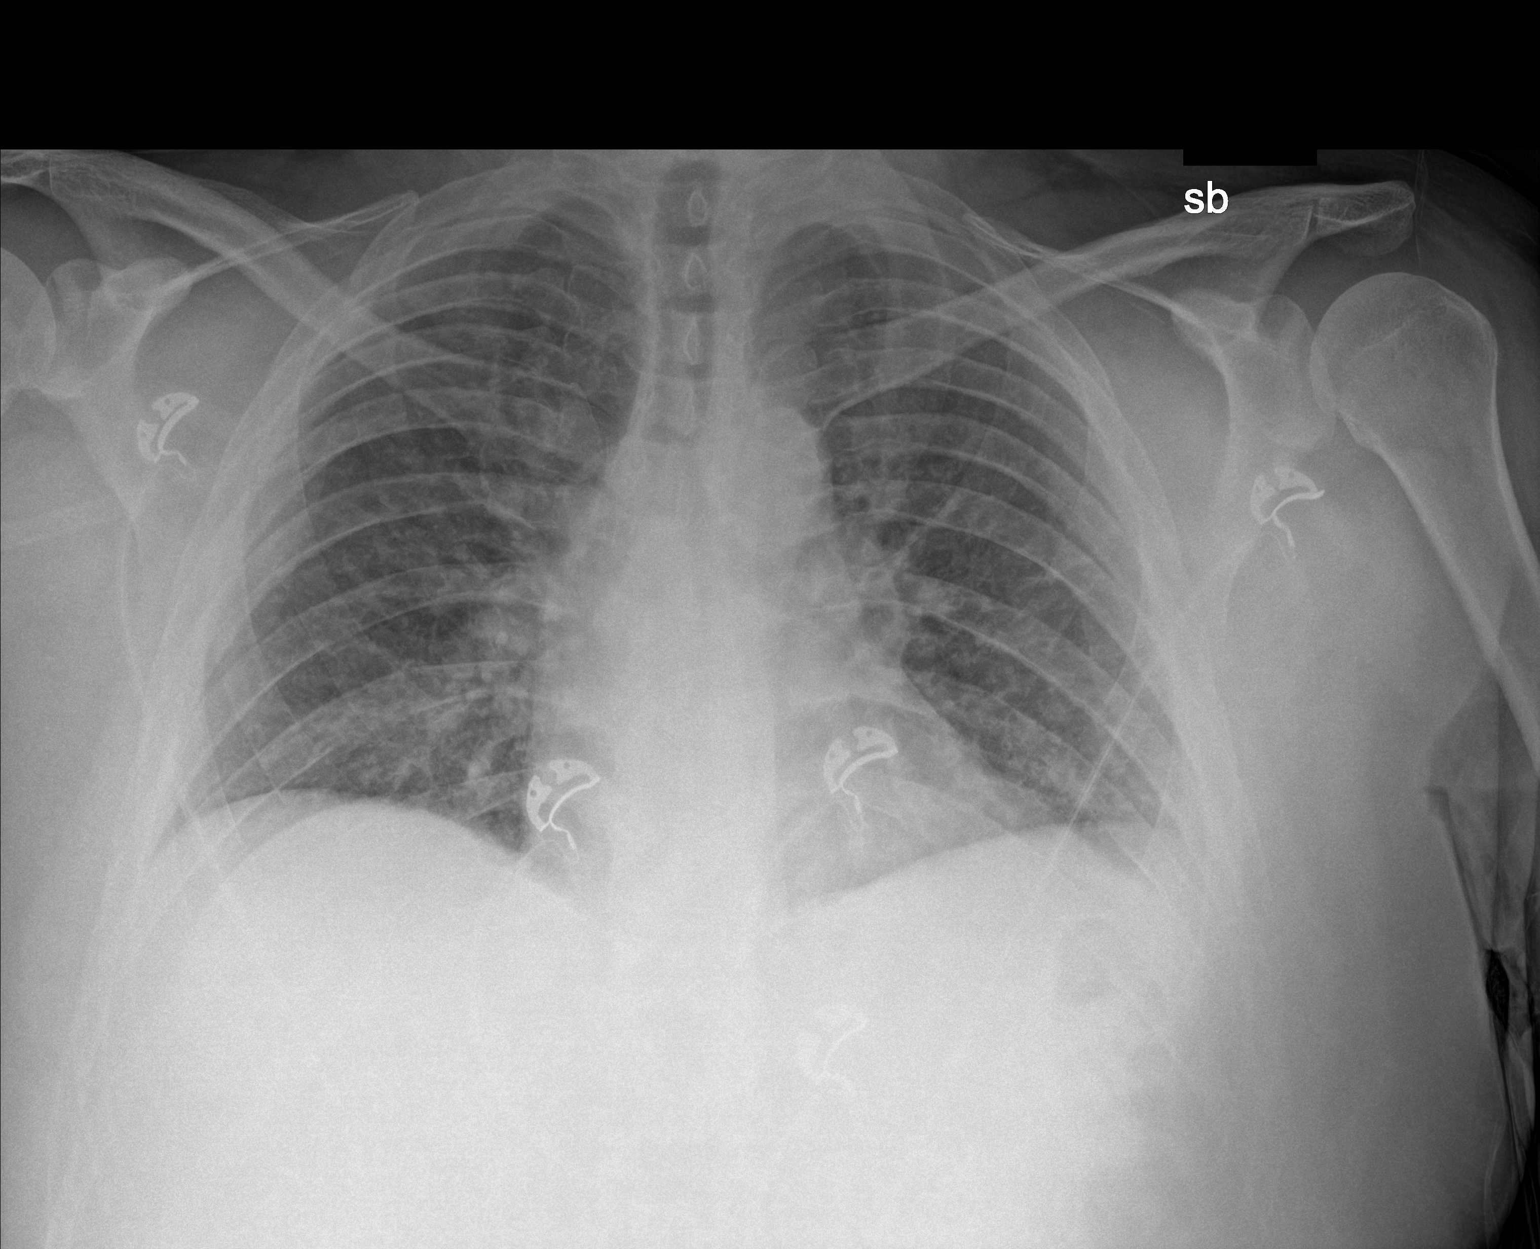

[1 of 1 positions shown; findings below may reference images not displayed]

FINDINGS: Normal cardiac silhouette and mediastinal contours given slightly
reduced lung volumes. There is diffuse slightly nodular thickening
of the pulmonary interstitium. No discrete focal airspace opacities.
No pleural effusion or pneumothorax. No evidence of edema. No acute
osseous abnormalities.
IMPRESSION: Diffuse slightly nodular thickening of the pulmonary interstitium as
could be seen in the setting airways disease/bronchitis, though
note, atypical infection could have a similar appearance. Clinical
correlation is advised.

## 2021-03-19 DIAGNOSIS — Z8669 Personal history of other diseases of the nervous system and sense organs: Secondary | ICD-10-CM | POA: Diagnosis not present

## 2021-03-19 DIAGNOSIS — I10 Essential (primary) hypertension: Secondary | ICD-10-CM | POA: Diagnosis not present

## 2021-03-19 DIAGNOSIS — H9211 Otorrhea, right ear: Secondary | ICD-10-CM | POA: Diagnosis not present

## 2021-04-13 DIAGNOSIS — F321 Major depressive disorder, single episode, moderate: Secondary | ICD-10-CM | POA: Diagnosis not present

## 2021-04-19 ENCOUNTER — Emergency Department
Admission: EM | Admit: 2021-04-19 | Discharge: 2021-04-19 | Disposition: A | Discharge status: Home / Self Care | Payer: BC Managed Care – PPO | Attending: Emergency Medicine | Admitting: Emergency Medicine

## 2021-04-19 ENCOUNTER — Emergency Department: Payer: BC Managed Care – PPO

## 2021-04-19 ENCOUNTER — Other Ambulatory Visit: Payer: Self-pay

## 2021-04-19 DIAGNOSIS — R21 Rash and other nonspecific skin eruption: Secondary | ICD-10-CM | POA: Diagnosis not present

## 2021-04-19 DIAGNOSIS — Z87891 Personal history of nicotine dependence: Secondary | ICD-10-CM | POA: Diagnosis not present

## 2021-04-19 DIAGNOSIS — M79662 Pain in left lower leg: Secondary | ICD-10-CM | POA: Diagnosis not present

## 2021-04-19 DIAGNOSIS — M7989 Other specified soft tissue disorders: Secondary | ICD-10-CM | POA: Diagnosis not present

## 2021-04-19 DIAGNOSIS — L03116 Cellulitis of left lower limb: Secondary | ICD-10-CM | POA: Diagnosis not present

## 2021-04-19 DIAGNOSIS — Z872 Personal history of diseases of the skin and subcutaneous tissue: Secondary | ICD-10-CM | POA: Diagnosis not present

## 2021-04-19 LAB — CBC
HCT: 43.6 % (ref 39.0–52.0)
Hemoglobin: 13.2 g/dL (ref 13.0–17.0)
MCH: 25.8 pg — ABNORMAL LOW (ref 26.0–34.0)
MCHC: 30.3 g/dL (ref 30.0–36.0)
MCV: 85.2 fL (ref 80.0–100.0)
Platelets: 161 10*3/uL (ref 150–400)
RBC: 5.12 MIL/uL (ref 4.22–5.81)
RDW: 14 % (ref 11.5–15.5)
WBC: 14.4 10*3/uL — ABNORMAL HIGH (ref 4.0–10.5)
nRBC: 0 % (ref 0.0–0.2)

## 2021-04-19 LAB — BASIC METABOLIC PANEL
Anion gap: 10 (ref 5–15)
BUN: 16 mg/dL (ref 6–20)
CO2: 24 mmol/L (ref 22–32)
Calcium: 9.1 mg/dL (ref 8.9–10.3)
Chloride: 101 mmol/L (ref 98–111)
Creatinine, Ser: 0.9 mg/dL (ref 0.61–1.24)
GFR, Estimated: >60 mL/min (ref >60)
Glucose, Bld: 104 mg/dL — ABNORMAL HIGH (ref 70–99)
Potassium: 4.9 mmol/L (ref 3.5–5.1)
Sodium: 135 mmol/L (ref 135–145)

## 2021-04-19 MED ORDER — IBUPROFEN 600 MG PO TABS
600.0000 mg | ORAL_TABLET | Freq: Once | ORAL | Status: AC
  Administered 2021-04-19: 600 mg via ORAL
  Filled 2021-04-19: qty 1

## 2021-04-19 MED ORDER — ACETAMINOPHEN 500 MG PO TABS
1000.0000 mg | ORAL_TABLET | Freq: Once | ORAL | Status: AC
  Administered 2021-04-19: 1000 mg via ORAL
  Filled 2021-04-19: qty 2

## 2021-04-19 MED ORDER — CEPHALEXIN 500 MG PO CAPS: 500.0000 mg | ORAL_CAPSULE | Freq: Four times a day (QID) | ORAL | 0 refills | Status: AC

## 2021-04-19 MED ORDER — CEPHALEXIN 500 MG PO CAPS
500.0000 mg | ORAL_CAPSULE | Freq: Once | ORAL | Status: AC
  Administered 2021-04-19: 500 mg via ORAL
  Filled 2021-04-19: qty 1

## 2021-04-19 NOTE — Discharge Instructions (Signed)
Please take Tylenol and ibuprofen/Advil for your pain.  It is safe to take them together, or to alternate them every few hours.  Take up to 1000mg  of Tylenol at a time, up to 4 times per day.  Do not take more than 4000 mg of Tylenol in 24 hours.  For ibuprofen, take 400-600 mg, 4-5 times per day.  Please take the Keflex antibiotic 4 times daily for the next 7 days.  Please finish all 28 pills, even if your leg is looking better.

## 2021-04-19 NOTE — ED Notes (Signed)
Attempt at labs unsuccessful. Lab contacted

## 2021-04-19 NOTE — ED Provider Notes (Signed)
[facility] Provider Note    Event Date/Time   First MD Initiated Contact with Patient 04/19/21 1747     (approximate)   History   Rash   HPI  Julian Franklin is a 36 y.o. male who presents to the ED for evaluation of Rash   I reviewed medical admission in 2020 for sepsis due to cellulitis.  Patient is morbidly obese.  Previous smoker.  Patient presents to the ED for evaluation of a rash over the past couple days to his left leg.  Denies any trauma, falls or syncope, but reports the rash cropping up by itself.  He reports swelling, pain, itchiness.  He reports pain to his distal left leg with ambulation.  Denies fevers or systemic symptoms. Reports rash and pain is similar to when he has had cellulitis in the past.  Physical Exam   Triage Vital Signs: ED Triage Vitals [04/19/21 1717]  Enc Vitals Group     BP (!) 148/104     Pulse Rate 90     Resp 20     Temp 98.6 F (37 C)     Temp src      SpO2 97 %     Weight 245 lb (111.1 kg)     Height 5\' 5"  (1.651 m)     Head Circumference      Peak Flow      Pain Score 2     Pain Loc      Pain Edu?      Excl. in GC?     Most recent vital signs: Vitals:   04/19/21 1717 04/19/21 1940  BP: (!) 148/104 (!) 141/97  Pulse: 90 79  Resp: 20 20  Temp: 98.6 F (37 C)   SpO2: 97% 97%    General: Awake, no distress.  CV:  Good peripheral perfusion.  Resp:  Normal effort.  Abd:  No distention.  MSK:  Soft tissue swelling noted to the left lower extremity, all distal to the knee.  Has an area of flat confluent erythema that is slightly warm to the touch and tender to palpation with mild induration, without fluctuance.  Especially below. Neuro:  No focal deficits appreciated. Other:       ED Results / Procedures / Treatments   Labs (all labs ordered are listed, but only abnormal results are displayed) Labs Reviewed  CBC - Abnormal; Notable for the following components:      Result Value    WBC 14.4 (*)    MCH 25.8 (*)    All other components within normal limits  BASIC METABOLIC PANEL - Abnormal; Notable for the following components:   Glucose, Bld 104 (*)    All other components within normal limits    EKG    RADIOLOGY Venous ultrasound reviewed by me without evidence of DVT  Official radiology report(s): US Venous Img Lower Unilateral Left  Result Date: 04/19/2021 CLINICAL DATA:  Pain swelling and erythema EXAM: Left LOWER EXTREMITY VENOUS DOPPLER ULTRASOUND TECHNIQUE: Gray-scale sonography with compression, as well as color and duplex ultrasound, were performed to evaluate the deep venous system(s) from the level of the common femoral vein through the popliteal and proximal calf veins. COMPARISON:  None. FINDINGS: VENOUS Normal compressibility of the popliteal veins, as well as the visualized calf veins. Patient was unable to tolerate compression of the left common femoral vein, profundal, femoral vein, or saphenofemoral junction. Visualized portions of profunda femoral vein and great saphenous vein unremarkable.  No filling defects to suggest DVT on grayscale or color Doppler imaging. Doppler waveforms show normal direction of venous flow, normal respiratory plasticity and response to augmentation. Limited views of the contralateral common femoral vein are unremarkable. OTHER None. Limitations: none IMPRESSION: 1. Limited study secondary to patient inability to tolerate compression from the left common femoral vein to the popliteal vein, however no gross filling defects are seen. 2. No convincing evidence for acute left lower extremity DVT Electronically Signed   By: Donavan Foil M.D.   On: 04/19/2021 19:26    PROCEDURES and INTERVENTIONS:  Procedures  Medications  cephALEXin (KEFLEX) capsule 500 mg (500 mg Oral Given 04/19/21 1809)  acetaminophen (TYLENOL) tablet 1,000 mg (1,000 mg Oral Given 04/19/21 1809)  ibuprofen (ADVIL) tablet 600 mg (600 mg Oral Given 04/19/21 1809)      IMPRESSION / MDM / ASSESSMENT AND PLAN / ED COURSE  I reviewed the triage vital signs and the nursing notes.  Obese 36 year old male present to the ED with left leg rash, likely cellulitis without evidence of sepsis.  He looks clinically well without evidence of systemic symptoms.  Blood work noted with a leukocytosis, reassuring metabolic panel.  I discussed with him my concern for the possibility of DVT to the left leg and he is agreeable to stay for a venous ultrasound to help rule this out. With his heart rate right at 90 and his leukocytosis, sepsis is a possibility but he look systemically well.  I considered medical admission for this patient, but he declines indicating just wants antibiotics and to get out of here.  We discussed return precautions for the ED and he is suitable for outpatient management.  His heart rate improves with oral fluids, analgesia and starting oral antibiotics.  He is ambulatory and requests discharge.     FINAL CLINICAL IMPRESSION(S) / ED DIAGNOSES   Final diagnoses:  Cellulitis of left lower extremity     Rx / DC Orders   ED Discharge Orders          Ordered    cephALEXin (KEFLEX) 500 MG capsule  4 times daily        04/19/21 1935             Note:  This document was prepared using Dragon voice recognition software and may include unintentional dictation errors.   Vladimir Crofts, MD 04/19/21 2021

## 2021-04-19 NOTE — ED Provider Triage Note (Signed)
Emergency Medicine Provider Triage Evaluation Note  Julian Franklin , a 36 y.o. male  was evaluated in triage.  Pt complains of recurrent left lower extremity redness.  Patient was seen for the same last spring, and noted complete resolution after course of oral antibiotics.  He presents today after he began to note erythema and itching to the lower leg over the weekend.  The rash extends circumferentially around the lower leg.  Denies any fevers, chills, or sweats.  Review of Systems  Positive: LLE erythema Negative: FCS  Physical Exam  BP (!) 148/104    Pulse 90    Temp 98.6 F (37 C)    Resp 20    Ht 5\' 5"  (1.651 m)    Wt 111.1 kg    SpO2 97%    BMI 40.77 kg/m  Gen:   Awake, no distress   Resp:  Normal effort  MSK:   Moves extremities without difficulty  Other:  SKIN: LLE with macular erythema to the lower leg. Mild warmth noted.   Medical Decision Making  Medically screening exam initiated at 5:25 PM.  Appropriate orders placed.  Truett Mcfarlan was informed that the remainder of the evaluation will be completed by another provider, this initial triage assessment does not replace that evaluation, and the importance of remaining in the ED until their evaluation is complete.  Patient ED evaluation of LLE erythema and itching.   Mancel Parsons, PA-C 04/19/21 1727

## 2021-04-19 NOTE — ED Triage Notes (Signed)
Pt to ED for rash to left lower leg that started over the weekend. Denies itching. States has had similar rash a year ago and was given antibiotics.

## 2021-04-19 NOTE — ED Notes (Signed)
See triage note  presents with left lower leg rash and swelling  states sx's started over the weekend

## 2021-06-16 ENCOUNTER — Ambulatory Visit: Payer: Self-pay | Admitting: Internal Medicine

## 2021-06-23 DIAGNOSIS — R600 Localized edema: Secondary | ICD-10-CM | POA: Diagnosis not present

## 2021-06-23 DIAGNOSIS — I10 Essential (primary) hypertension: Secondary | ICD-10-CM | POA: Diagnosis not present

## 2021-06-23 DIAGNOSIS — L0390 Cellulitis, unspecified: Secondary | ICD-10-CM | POA: Diagnosis not present

## 2021-06-30 DIAGNOSIS — I10 Essential (primary) hypertension: Secondary | ICD-10-CM | POA: Diagnosis not present

## 2021-06-30 DIAGNOSIS — R600 Localized edema: Secondary | ICD-10-CM | POA: Diagnosis not present

## 2021-06-30 DIAGNOSIS — M7989 Other specified soft tissue disorders: Secondary | ICD-10-CM | POA: Diagnosis not present

## 2021-06-30 DIAGNOSIS — L0390 Cellulitis, unspecified: Secondary | ICD-10-CM | POA: Diagnosis not present

## 2022-02-25 DIAGNOSIS — Z419 Encounter for procedure for purposes other than remedying health state, unspecified: Secondary | ICD-10-CM | POA: Diagnosis not present

## 2022-03-28 DIAGNOSIS — Z419 Encounter for procedure for purposes other than remedying health state, unspecified: Secondary | ICD-10-CM | POA: Diagnosis not present

## 2022-04-28 DIAGNOSIS — Z419 Encounter for procedure for purposes other than remedying health state, unspecified: Secondary | ICD-10-CM | POA: Diagnosis not present

## 2022-05-11 ENCOUNTER — Telehealth: Payer: Self-pay

## 2022-05-11 NOTE — Telephone Encounter (Signed)
Mychart msg sent. AS, CMA

## 2022-05-21 ENCOUNTER — Emergency Department: Payer: Medicaid Other

## 2022-05-21 ENCOUNTER — Other Ambulatory Visit: Payer: Self-pay

## 2022-05-21 ENCOUNTER — Emergency Department
Admission: EM | Admit: 2022-05-21 | Discharge: 2022-05-21 | Disposition: A | Discharge status: Home / Self Care | Payer: Medicaid Other | Attending: Emergency Medicine | Admitting: Emergency Medicine

## 2022-05-21 DIAGNOSIS — L03031 Cellulitis of right toe: Secondary | ICD-10-CM | POA: Insufficient documentation

## 2022-05-21 DIAGNOSIS — J45909 Unspecified asthma, uncomplicated: Secondary | ICD-10-CM | POA: Diagnosis not present

## 2022-05-21 DIAGNOSIS — M79671 Pain in right foot: Secondary | ICD-10-CM

## 2022-05-21 DIAGNOSIS — I10 Essential (primary) hypertension: Secondary | ICD-10-CM | POA: Insufficient documentation

## 2022-05-21 DIAGNOSIS — M79674 Pain in right toe(s): Secondary | ICD-10-CM | POA: Diagnosis not present

## 2022-05-21 HISTORY — DX: Essential (primary) hypertension: I10

## 2022-05-21 MED ORDER — DOXYCYCLINE HYCLATE 100 MG PO TABS: 100.0000 mg | ORAL_TABLET | Freq: Two times a day (BID) | ORAL | 0 refills | Status: DC

## 2022-05-21 NOTE — Discharge Instructions (Signed)
Soak the foot in warm water and Epsom salts.  May also add apple cider vinegar. Use an over-the-counter athlete's foot medication. Take the antibiotic as prescribed If the pain in your foot is not improving in 1 week please follow-up with podiatry If the redness is spreading and you are worsening please return emergency department

## 2022-05-21 NOTE — ED Provider Notes (Signed)
[facility] Provider Note    Event Date/Time   First MD Initiated Contact with Patient 05/21/22 1117     (approximate)   History   Foot Pain   HPI  Julian Franklin is a 37 y.o. male with history of asthma and hypertension presents emergency department complaining of right foot pain for 1.5 weeks.  Patient states he walks on concrete at work.  Noticed that his toes are red.  No fever or chills.  No drainage from the area of the toes and no open skin.  No history of diabetes.      Physical Exam   Triage Vital Signs: ED Triage Vitals  Enc Vitals Group     BP 05/21/22 1059 132/89     Pulse Rate 05/21/22 1059 75     Resp 05/21/22 1059 18     Temp 05/21/22 1059 98.1 F (36.7 C)     Temp Source 05/21/22 1059 Oral     SpO2 05/21/22 1059 98 %     Weight 05/21/22 1103 200 lb (90.7 kg)     Height 05/21/22 1103 '5\' 5"'$  (1.651 m)     Head Circumference --      Peak Flow --      Pain Score --      Pain Loc --      Pain Edu? --      Excl. in [place]? --     Most recent vital signs: Vitals:   05/21/22 1059  BP: 132/89  Pulse: 75  Resp: 18  Temp: 98.1 F (36.7 C)  SpO2: 98%     General: Awake, no distress.   CV:  Good peripheral perfusion. regular rate and  rhythm Resp:  Normal effort.  Abd:  No distention.   Other:  Right foot is tender along the fourth and fifth metatarsals in the midfoot, slight tenderness noticed distally on the metatarsals, toes are tender when moved.  No open skin noted.   ED Results / Procedures / Treatments   Labs (all labs ordered are listed, but only abnormal results are displayed) Labs Reviewed - No data to display   EKG     RADIOLOGY X-ray of the right foot    PROCEDURES:   Procedures   MEDICATIONS ORDERED IN ED: Medications - No data to display   IMPRESSION / MDM / [place] / ED COURSE  I reviewed the triage vital signs and the nursing notes.                               Differential diagnosis includes, but is not limited to, cellulitis, stress fracture, osteomyelitis, athlete's foot  Patient's presentation is most consistent with acute complicated illness / injury requiring diagnostic workup.   Patient is going to elevate foot while in the exam room to see if the redness dissipates.  In the meantime we will get a x-ray to assess for fracture.   X-ray of the right foot independently reviewed interpreted by me as being negative for any acute abnormality confirmed by radiology  The redness remains on the toes.  Will go ahead and treat him with antibiotic for cellulitis.  Patient is to follow-up podiatry if not improving in 1 week.  Also instructed him to use athlete's foot cream.  He is in agreement treatment plan.  Discharged in stable condition.   FINAL CLINICAL IMPRESSION(S) / ED DIAGNOSES   Final diagnoses:  Right foot pain  Cellulitis of toe of right foot     Rx / DC Orders   ED Discharge Orders          Ordered    doxycycline (VIBRA-TABS) 100 MG tablet  2 times daily        05/21/22 1156             Note:  This document was prepared using Dragon voice recognition software and may include unintentional dictation errors.    Versie Starks, PA-C 05/21/22 1200    Carrie Mew, MD 05/22/22 [phone]

## 2022-05-21 NOTE — ED Triage Notes (Signed)
Pt to ED POV for R foot pain since 1.5 wk, denies known injury or redness or purulent drainage. States pain is on top of foot and has slight swelling to R lateral foot. States walks on concrete a lot at work. Pain is worse with WB.

## 2022-05-24 ENCOUNTER — Telehealth: Payer: Self-pay

## 2022-05-24 NOTE — Telephone Encounter (Signed)
..   Medicaid Managed Care   Unsuccessful Outreach Note  05/24/2022 Name: Julian Franklin MRN: [id number] DOB: Aug 23, 1985  Referred by: Patient, No Pcp Per Reason for referral : Appointment (I called the patient today to get him scheduled with the MM Team. His phone was not in order.)   An unsuccessful telephone outreach was attempted today. The patient was referred to the case management team for assistance with care management and care coordination.   Follow Up Plan: The care management team will reach out to the patient again over the next 7 days.   [place]

## 2022-05-27 DIAGNOSIS — Z419 Encounter for procedure for purposes other than remedying health state, unspecified: Secondary | ICD-10-CM | POA: Diagnosis not present

## 2022-05-30 ENCOUNTER — Encounter: Payer: Self-pay | Admitting: Emergency Medicine

## 2022-05-30 ENCOUNTER — Emergency Department
Admission: EM | Admit: 2022-05-30 | Discharge: 2022-05-30 | Disposition: A | Discharge status: Home / Self Care | Payer: Medicaid Other | Attending: Emergency Medicine | Admitting: Emergency Medicine

## 2022-05-30 ENCOUNTER — Other Ambulatory Visit: Payer: Self-pay

## 2022-05-30 DIAGNOSIS — M79671 Pain in right foot: Secondary | ICD-10-CM | POA: Insufficient documentation

## 2022-05-30 DIAGNOSIS — M722 Plantar fascial fibromatosis: Secondary | ICD-10-CM | POA: Diagnosis not present

## 2022-05-30 MED ORDER — METHOCARBAMOL 500 MG PO TABS: 500.0000 mg | ORAL_TABLET | Freq: Three times a day (TID) | ORAL | 1 refills | Status: DC | PRN

## 2022-05-30 MED ORDER — NAPROXEN 500 MG PO TABS: 500.0000 mg | ORAL_TABLET | Freq: Two times a day (BID) | ORAL | 2 refills | Status: DC

## 2022-05-30 NOTE — ED Provider Notes (Signed)
[facility] Provider Note    Event Date/Time   First MD Initiated Contact with Patient 05/30/22 0827     (approximate)   History   Foot Pain   HPI  Julian Franklin is a 37 y.o. male who presents with foot pain.  Patient seen in the emergency department last week, completed course of antibiotics with little improvement.  Complains of pain in the bottom forefoot as well as in the dorsal midfoot area.  No trauma.  Does report that he walks 14,000 steps a day on concrete     Physical Exam   Triage Vital Signs: ED Triage Vitals  Enc Vitals Group     BP 05/30/22 0825 (!) 145/101     Pulse Rate 05/30/22 0825 87     Resp 05/30/22 0825 18     Temp 05/30/22 0825 98 F (36.7 C)     Temp Source 05/30/22 0825 Oral     SpO2 05/30/22 0825 93 %     Weight 05/30/22 0839 91 kg (200 lb 9.9 oz)     Height 05/30/22 0839 1.651 m ('5\' 5"'$ )     Head Circumference --      Peak Flow --      Pain Score 05/30/22 0825 8     Pain Loc --      Pain Edu? --      Excl. in [place]? --     Most recent vital signs: Vitals:   05/30/22 0825  BP: (!) 145/101  Pulse: 87  Resp: 18  Temp: 98 F (36.7 C)  SpO2: 93%     General: Awake, no distress.  CV:  Good peripheral perfusion.  Resp:  Normal effort.  Abd:  No distention.  Other:  Mild tenderness along the forefoot on the plantar surface, no erythema or skin break.  Mild tenderness along the dorsal midfoot, no swelling or bony abnormalities palpated.  Positive athlete's foot   ED Results / Procedures / Treatments   Labs (all labs ordered are listed, but only abnormal results are displayed) Labs Reviewed - No data to display   EKG     RADIOLOGY     PROCEDURES:  Critical Care performed:   Procedures   MEDICATIONS ORDERED IN ED: Medications - No data to display   IMPRESSION / MDM / [place] / ED COURSE  I reviewed the triage vital signs and the nursing notes. Patient's presentation is  most consistent with acute complicated illness / injury requiring diagnostic workup.  Patient presents with foot pain as above, suspicious for plantar fasciitis given exam.  No evidence of infection.  Does have athlete's foot but I doubt this has anything to do with his presentation.  We discussed exercises for plantar fasciitis, need for podiatry follow-up, analgesics provided        FINAL CLINICAL IMPRESSION(S) / ED DIAGNOSES   Final diagnoses:  Plantar fasciitis of right foot     Rx / DC Orders   ED Discharge Orders          Ordered    naproxen (NAPROSYN) 500 MG tablet  2 times daily with meals        05/30/22 0845    methocarbamol (ROBAXIN) 500 MG tablet  Every 8 hours PRN        05/30/22 0845             Note:  This document was prepared using Dragon voice recognition software and may include unintentional dictation  errors.   Lavonia Drafts, MD 05/30/22 [phone]

## 2022-05-30 NOTE — ED Notes (Signed)
See triage note  Presents with cont'd pain to right foot  States this pain has been there fo rabout 1 month  Was seen last week   Given doxy  But states pain is worse

## 2022-05-30 NOTE — ED Triage Notes (Signed)
Patient to ED via POV for right foot pain- especially top. Seen for same last week and given antibiotics but states it is getting worse instead of better. Denies redness or swelling but pain is worse and harder to walk on it.

## 2022-06-23 DIAGNOSIS — T1502XA Foreign body in cornea, left eye, initial encounter: Secondary | ICD-10-CM | POA: Diagnosis not present

## 2022-06-27 DIAGNOSIS — Z419 Encounter for procedure for purposes other than remedying health state, unspecified: Secondary | ICD-10-CM | POA: Diagnosis not present

## 2022-06-28 DIAGNOSIS — T1502XD Foreign body in cornea, left eye, subsequent encounter: Secondary | ICD-10-CM | POA: Diagnosis not present

## 2022-07-27 DIAGNOSIS — Z419 Encounter for procedure for purposes other than remedying health state, unspecified: Secondary | ICD-10-CM | POA: Diagnosis not present

## 2022-08-27 DIAGNOSIS — Z419 Encounter for procedure for purposes other than remedying health state, unspecified: Secondary | ICD-10-CM | POA: Diagnosis not present

## 2022-09-26 DIAGNOSIS — Z419 Encounter for procedure for purposes other than remedying health state, unspecified: Secondary | ICD-10-CM | POA: Diagnosis not present

## 2022-10-27 DIAGNOSIS — Z419 Encounter for procedure for purposes other than remedying health state, unspecified: Secondary | ICD-10-CM | POA: Diagnosis not present

## 2022-11-27 DIAGNOSIS — Z419 Encounter for procedure for purposes other than remedying health state, unspecified: Secondary | ICD-10-CM | POA: Diagnosis not present

## 2022-12-14 ENCOUNTER — Other Ambulatory Visit: Payer: Self-pay

## 2022-12-14 ENCOUNTER — Emergency Department
Admission: EM | Admit: 2022-12-14 | Discharge: 2022-12-14 | Disposition: A | Discharge status: Home / Self Care | Payer: BLUE CROSS/BLUE SHIELD | Attending: Student in an Organized Health Care Education/Training Program | Admitting: Student in an Organized Health Care Education/Training Program

## 2022-12-14 ENCOUNTER — Emergency Department: Payer: BLUE CROSS/BLUE SHIELD

## 2022-12-14 DIAGNOSIS — J45909 Unspecified asthma, uncomplicated: Secondary | ICD-10-CM | POA: Diagnosis not present

## 2022-12-14 DIAGNOSIS — M79604 Pain in right leg: Secondary | ICD-10-CM | POA: Diagnosis not present

## 2022-12-14 DIAGNOSIS — I10 Essential (primary) hypertension: Secondary | ICD-10-CM | POA: Insufficient documentation

## 2022-12-14 DIAGNOSIS — L03115 Cellulitis of right lower limb: Secondary | ICD-10-CM | POA: Diagnosis not present

## 2022-12-14 DIAGNOSIS — R600 Localized edema: Secondary | ICD-10-CM | POA: Diagnosis not present

## 2022-12-14 LAB — CBC WITH DIFFERENTIAL/PLATELET
Abs Immature Granulocytes: 0.02 10*3/uL (ref 0.00–0.07)
Basophils Absolute: 0 10*3/uL (ref 0.0–0.1)
Basophils Relative: 0 %
Eosinophils Absolute: 0.2 10*3/uL (ref 0.0–0.5)
Eosinophils Relative: 2 %
HCT: 43.9 % (ref 39.0–52.0)
Hemoglobin: 13.8 g/dL (ref 13.0–17.0)
Immature Granulocytes: 0 %
Lymphocytes Relative: 30 %
Lymphs Abs: 2.7 10*3/uL (ref 0.7–4.0)
MCH: 26 pg (ref 26.0–34.0)
MCHC: 31.4 g/dL (ref 30.0–36.0)
MCV: 82.8 fL (ref 80.0–100.0)
Monocytes Absolute: 1 10*3/uL (ref 0.1–1.0)
Monocytes Relative: 10 %
Neutro Abs: 5.2 10*3/uL (ref 1.7–7.7)
Neutrophils Relative %: 58 %
Platelets: 260 10*3/uL (ref 150–400)
RBC: 5.3 MIL/uL (ref 4.22–5.81)
RDW: 13.4 % (ref 11.5–15.5)
WBC: 9.2 10*3/uL (ref 4.0–10.5)
nRBC: 0 % (ref 0.0–0.2)

## 2022-12-14 LAB — BASIC METABOLIC PANEL WITH GFR
Anion gap: 8 (ref 5–15)
BUN: 15 mg/dL (ref 6–20)
CO2: 24 mmol/L (ref 22–32)
Calcium: 9 mg/dL (ref 8.9–10.3)
Chloride: 104 mmol/L (ref 98–111)
Creatinine, Ser: 0.75 mg/dL (ref 0.61–1.24)
GFR, Estimated: >60 mL/min (ref >60)
Glucose, Bld: 101 mg/dL — ABNORMAL HIGH (ref 70–99)
Potassium: 4.2 mmol/L (ref 3.5–5.1)
Sodium: 136 mmol/L (ref 135–145)

## 2022-12-14 MED ORDER — CEPHALEXIN 500 MG PO CAPS: 500.0000 mg | ORAL_CAPSULE | Freq: Three times a day (TID) | ORAL | 0 refills | Status: DC

## 2022-12-14 MED ORDER — SULFAMETHOXAZOLE-TRIMETHOPRIM 800-160 MG PO TABS: 1.0000 | ORAL_TABLET | Freq: Two times a day (BID) | ORAL | 0 refills | Status: DC

## 2022-12-14 NOTE — ED Notes (Signed)
Pt d/c home per MD order. Discharge summary reviewed, pt verbalizes understanding. Ambulatory off unit. No s/s of acute distress noted at discharge.

## 2022-12-14 NOTE — ED Triage Notes (Signed)
Pt to ED via POV from home. Pt reports right anterior lower leg pain and rash that he noticed last night. Pt reports sometimes he feels like he could fall from the pain. Pt ambulatory to triage room.

## 2022-12-14 NOTE — ED Provider Notes (Signed)
[facility] Provider Note    Event Date/Time   First MD Initiated Contact with Patient 12/14/22 1123     (approximate)   History   Leg Pain and Rash   HPI  Julian Franklin is a 37 y.o. male   presents to the ED with complaint of right lower leg pain with a development of an erythematous area to the anterior portion of his leg that began last evening and is worse this morning.  Patient states it feels hot to touch and his leg feels swollen.  He states that he feels that he cannot put his full weight on his leg.  He denies any previous injury or recent trauma to his leg.  He denies any previous DVTs but has had similar issues that appear like this.  Patient has history of asthma and hypertension.      Physical Exam   Triage Vital Signs: ED Triage Vitals  Encounter Vitals Group     BP 12/14/22 1055 (!) 139/104     Systolic BP Percentile --      Diastolic BP Percentile --      Pulse Rate 12/14/22 1055 67     Resp 12/14/22 1055 18     Temp 12/14/22 1055 97.8 F (36.6 C)     Temp Source 12/14/22 1055 Oral     SpO2 12/14/22 1055 95 %     Weight --      Height 12/14/22 1054 5\' 5"  (1.651 m)     Head Circumference --      Peak Flow --      Pain Score 12/14/22 1054 5     Pain Loc --      Pain Education --      Exclude from Growth Chart --     Most recent vital signs: Vitals:   12/14/22 1055  BP: (!) 139/104  Pulse: 67  Resp: 18  Temp: 97.8 F (36.6 C)  SpO2: 95%     General: Awake, no distress.  CV:  Good peripheral perfusion.  Resp:  Normal effort.  Abd:  No distention.  Other:  Examination there is an erythematous area distal tib-fib anteriorly with warmth and tenderness.  No pitting edema present.  Nontender palpation of the ankle or foot area.  Pulses present and skin is intact.  No Denna Haggard' sign at this time.   ED Results / Procedures / Treatments   Labs (all labs ordered are listed, but only abnormal results are  displayed) Labs Reviewed  BASIC METABOLIC PANEL - Abnormal; Notable for the following components:      Result Value   Glucose, Bld 101 (*)    All other components within normal limits  CBC WITH DIFFERENTIAL/PLATELET     RADIOLOGY  Ultrasound right lower extremity per radiologist was negative for DVT.   PROCEDURES:  Critical Care performed:   Procedures   MEDICATIONS ORDERED IN ED: Medications - No data to display   IMPRESSION / MDM / ASSESSMENT AND PLAN / ED COURSE  I reviewed the triage vital signs and the nursing notes.   Differential diagnosis includes, but is not limited to, cellulitis, DVT, contact dermatitis, folliculitis.  37 year old male presents to the ED with complaint of right lower extremity rash that is warm and tender to palpation.  Patient has been treated in the past for cellulitis.  Patient reports that he is on his feet quite a bit at work.  He has been evaluated for DVTs in the  past which have all been negative.  Lab work is reassuring and ultrasound was negative per radiologist for DVT.  I discussed with patient that we would place him on 2 antibiotics and elevate his legs frequently.  A note was given for him to remain out of work for the next several days.  Is to follow-up with his PCP or urgent care if any continued problems.      Patient's presentation is most consistent with acute illness / injury with system symptoms.  FINAL CLINICAL IMPRESSION(S) / ED DIAGNOSES   Final diagnoses:  Cellulitis of right leg     Rx / DC Orders   ED Discharge Orders          Ordered    sulfamethoxazole-trimethoprim (BACTRIM DS) 800-160 MG tablet  2 times daily        12/14/22 1406    cephALEXin (KEFLEX) 500 MG capsule  3 times daily        12/14/22 1406             Note:  This document was prepared using Dragon voice recognition software and may include unintentional dictation errors.   Tommi Rumps, PA-C 12/14/22 1411    Willy Eddy, MD 12/14/22 1500

## 2022-12-14 NOTE — Discharge Instructions (Addendum)
Follow-up with your primary care provider or urgent care if any continued problems.  Return to the emergency department if any severe worsening of your symptoms.  2 antibiotics were sent to the pharmacy to begin taking today.  Elevate your leg frequently.  Your ultrasound did not show a blood clot and the redness is coming from infection.  May take Tylenol or ibuprofen with these antibiotics as needed for pain.

## 2022-12-27 DIAGNOSIS — Z419 Encounter for procedure for purposes other than remedying health state, unspecified: Secondary | ICD-10-CM | POA: Diagnosis not present

## 2023-01-16 ENCOUNTER — Telehealth: Payer: Self-pay

## 2023-01-16 NOTE — Telephone Encounter (Signed)
Transition Care Management Follow-up Telephone Call Date of discharge and from where: 12/14/2022 [facility]. Patient declined to participate.  Julian Franklin [facility]  [facility], [facility] Guide Direct Dial: [phone]  Website: [name]

## 2023-01-27 DIAGNOSIS — Z419 Encounter for procedure for purposes other than remedying health state, unspecified: Secondary | ICD-10-CM | POA: Diagnosis not present

## 2023-02-26 DIAGNOSIS — Z419 Encounter for procedure for purposes other than remedying health state, unspecified: Secondary | ICD-10-CM | POA: Diagnosis not present

## 2023-03-03 ENCOUNTER — Encounter: Payer: Self-pay | Admitting: General Practice

## 2023-03-03 ENCOUNTER — Ambulatory Visit: Payer: BC Managed Care – PPO | Admitting: General Practice

## 2023-03-03 VITALS — BP 120/84 | HR 96 | Temp 99.5°F | Ht 66.2 in | Wt 280.0 lb

## 2023-03-03 DIAGNOSIS — I10 Essential (primary) hypertension: Secondary | ICD-10-CM

## 2023-03-03 DIAGNOSIS — Z833 Family history of diabetes mellitus: Secondary | ICD-10-CM

## 2023-03-03 DIAGNOSIS — Z0000 Encounter for general adult medical examination without abnormal findings: Secondary | ICD-10-CM

## 2023-03-03 DIAGNOSIS — J4520 Mild intermittent asthma, uncomplicated: Secondary | ICD-10-CM | POA: Diagnosis not present

## 2023-03-03 DIAGNOSIS — E66813 Obesity, class 3: Secondary | ICD-10-CM

## 2023-03-03 DIAGNOSIS — Z6841 Body Mass Index (BMI) 40.0 and over, adult: Secondary | ICD-10-CM

## 2023-03-03 MED ORDER — ALBUTEROL SULFATE (2.5 MG/3ML) 0.083% IN NEBU: 2.5000 mg | INHALATION_SOLUTION | RESPIRATORY_TRACT | 0 refills | Status: AC | PRN

## 2023-03-03 NOTE — Assessment & Plan Note (Signed)
Family history of diabetes in mother.   A1c pending.

## 2023-03-03 NOTE — Progress Notes (Signed)
New Patient Office Visit  Subjective    Patient ID: Julian Franklin, male    DOB: 10/14/1985  Age: 37 y.o. MRN: [id number]  CC:  Chief Complaint  Patient presents with   New Patient (Initial Visit)    HPI Julian Franklin is a 37 y.o. male presents to establish care and a complete physical exam.   Immunizations: -Tetanus: unknown -Influenza: declines - Covid- had one dose.   Diet: Fair diet.  Exercise: No regular exercise. Very physically active at work.  Eye exam: completed several years ago.  Dental exam: completed several years ago.   No recent PCP in 5-6 years.   Asthma: History of asthma as a child. Last exacerbation was last year. Uses albuterol nebulizer when he gets a cold.   Hypertension: He was diagnosed with high blood pressure two years ago when he was in the hospital. He was started on hydrochlorothiazide in 2019. He finished the prescription but did not have any refills. He denies any chest pain, shortness of breath, unusual headaches, new onset unilateral weakness. He does have blurred vision but correlates that to needing glasses.    Outpatient Encounter Medications as of 03/03/2023  Medication Sig   acetaminophen (TYLENOL) 500 MG tablet Take 500 mg by mouth every 6 (six) hours as needed for headache.   [DISCONTINUED] albuterol (PROVENTIL) (2.5 MG/3ML) 0.083% nebulizer solution Take 3 mLs (2.5 mg total) by nebulization every 4 (four) hours as needed for wheezing or shortness of breath.   albuterol (PROVENTIL) (2.5 MG/3ML) 0.083% nebulizer solution Take 3 mLs (2.5 mg total) by nebulization every 4 (four) hours as needed for wheezing or shortness of breath.   [DISCONTINUED] cephALEXin (KEFLEX) 500 MG capsule Take 1 capsule (500 mg total) by mouth 3 (three) times daily.   [DISCONTINUED] sulfamethoxazole-trimethoprim (BACTRIM DS) 800-160 MG tablet Take 1 tablet by mouth 2 (two) times daily.   No facility-administered encounter medications on file as of  03/03/2023.    Past Medical History:  Diagnosis Date   Asthma    Hypertension     Past Surgical History:  Procedure Laterality Date   APPENDECTOMY     INNER EAR SURGERY      Family History  Problem Relation Age of Onset   Diabetes Mother    Hypertension Father    Hyperlipidemia Father    Arthritis Father    Heart disease Maternal Aunt    Heart disease Maternal Uncle    Heart disease Cousin     Social History   Socioeconomic History   Marital status: Married    Spouse name: Not on file   Number of children: 3   Years of education: Not on file   Highest education level: Not on file  Occupational History   Occupation: Location manager  Tobacco Use   Smoking status: Former    Current packs/day: 0.00    Types: Cigarettes    Quit date: 2000    Years since quitting: 24.9   Smokeless tobacco: Never  Vaping Use   Vaping status: Never Used  Substance and Sexual Activity   Alcohol use: No    Comment: rarely   Drug use: No   Sexual activity: Yes    Partners: Female    Birth control/protection: None  Other Topics Concern   Not on file  Social History Narrative   Not on file   Social Determinants of Health   Financial Resource Strain: Not on file  Food Insecurity: Not on file  Transportation  Needs: Not on file  Physical Activity: Not on file  Stress: Not on file  Social Connections: Not on file  Intimate Partner Violence: Not on file    Review of Systems  Constitutional:  Negative for chills, fever, malaise/fatigue and weight loss.  HENT:  Negative for congestion, ear pain, hearing loss, nosebleeds, sinus pain, sore throat and tinnitus.   Eyes:  Negative for blurred vision, double vision, pain, discharge and redness.  Respiratory:  Negative for cough, shortness of breath, wheezing and stridor.   Cardiovascular:  Negative for chest pain, palpitations and leg swelling.  Gastrointestinal:  Negative for abdominal pain, constipation, diarrhea, heartburn, nausea  and vomiting.  Genitourinary:  Negative for dysuria, frequency and urgency.  Musculoskeletal:  Negative for myalgias.  Skin:  Negative for rash.  Neurological:  Positive for tingling. Negative for dizziness, seizures, weakness and headaches.       Right Great toe.  Psychiatric/Behavioral:  Negative for depression, substance abuse and suicidal ideas. The patient is not nervous/anxious.         Objective    BP 120/84 (BP Location: Left Arm, Patient Position: Sitting, Cuff Size: Large)   Pulse 96   Temp 99.5 F (37.5 C) (Oral)   Ht 5' 6.2" (1.681 m)   Wt 280 lb (127 kg)   SpO2 96%   BMI 44.92 kg/m   Physical Exam Vitals and nursing note reviewed.  Constitutional:      Appearance: Normal appearance.  HENT:     Head: Normocephalic and atraumatic.     Right Ear: Tympanic membrane, ear canal and external ear normal.     Left Ear: Tympanic membrane, ear canal and external ear normal.     Nose: Nose normal.     Mouth/Throat:     Mouth: Mucous membranes are moist.     Pharynx: Oropharynx is clear.  Eyes:     Conjunctiva/sclera: Conjunctivae normal.     Pupils: Pupils are equal, round, and reactive to light.  Cardiovascular:     Rate and Rhythm: Normal rate and regular rhythm.     Pulses: Normal pulses.     Heart sounds: Normal heart sounds.  Pulmonary:     Effort: Pulmonary effort is normal.     Breath sounds: Normal breath sounds.  Abdominal:     General: Abdomen is flat. Bowel sounds are normal.     Palpations: Abdomen is soft.  Musculoskeletal:        General: Normal range of motion.     Cervical back: Normal range of motion.  Skin:    General: Skin is warm and dry.     Capillary Refill: Capillary refill takes less than 2 seconds.  Neurological:     General: No focal deficit present.     Mental Status: He is alert and oriented to person, place, and time. Mental status is at baseline.  Psychiatric:        Mood and Affect: Mood normal.        Behavior: Behavior  normal.        Thought Content: Thought content normal.        Judgment: Judgment normal.         Assessment & Plan:  Encounter for screening and preventative care Assessment & Plan: Immunizations tetanus due. Declines flu and covid.  Discussed the importance of a healthy diet and regular exercise in order for weight loss, and to reduce the risk of further co-morbidity.  Exam stable. Labs pending.  Follow up in  1 year for repeat physical.    Mild intermittent asthma without complication Assessment & Plan: Stable. Last exacerbation one year ago.   Declines Albuterol inhaler.   Rx sent for Albuterol nebulizer.   Orders: -     Albuterol Sulfate; Take 3 mLs (2.5 mg total) by nebulization every 4 (four) hours as needed for wheezing or shortness of breath.  Dispense: 75 mL; Refill: 0  Essential (primary) hypertension Assessment & Plan: Stable. BP at goal today.   No medications in the last year.   Discussed the importance of monitoring diet and limit salt intake.  He will check his blood pressures at home for 1 week and send them via MyChart.   CMP, CBC pending  Orders: -     CBC; Future -     Comprehensive metabolic panel; Future  Family history of diabetes mellitus Assessment & Plan: Family history of diabetes in mother.   A1c pending.  Orders: -     Hemoglobin A1c; Future -     Lipid panel; Future  Class 3 severe obesity due to excess calories with body mass index (BMI) of 40.0 to 44.9 in adult, unspecified whether serious comorbidity present [facility]) Assessment & Plan: Discussed the importance of monitoring diet and exercise.  Considering nutrition/dietician referral.   Lipid panel pending. Hemoglobin A1c pending   Orders: -     Hemoglobin A1c; Future -     Lipid panel; Future     Return in about 2 weeks (around 03/17/2023).   Modesto Charon, NP

## 2023-03-03 NOTE — Assessment & Plan Note (Signed)
Immunizations tetanus due. Declines flu and covid.  Discussed the importance of a healthy diet and regular exercise in order for weight loss, and to reduce the risk of further co-morbidity.  Exam stable. Labs pending.  Follow up in 1 year for repeat physical.

## 2023-03-03 NOTE — Patient Instructions (Signed)
Stop by the lab prior to leaving today. I will notify you of your results once received.   Please send the blood pressure readings on my chart in 1 week.   Try the debrox ear solution.   Follow up in 2 weeks.   It was a pleasure meeting you!

## 2023-03-03 NOTE — Assessment & Plan Note (Signed)
Stable. BP at goal today.   No medications in the last year.   Discussed the importance of monitoring diet and limit salt intake.  He will check his blood pressures at home for 1 week and send them via MyChart.   CMP, CBC pending

## 2023-03-03 NOTE — Assessment & Plan Note (Signed)
Stable. Last exacerbation one year ago.   Declines Albuterol inhaler.   Rx sent for Albuterol nebulizer.

## 2023-03-03 NOTE — Assessment & Plan Note (Addendum)
Discussed the importance of monitoring diet and exercise.  Considering nutrition/dietician referral.   Lipid panel pending. Hemoglobin A1c pending

## 2023-03-06 ENCOUNTER — Other Ambulatory Visit (INDEPENDENT_AMBULATORY_CARE_PROVIDER_SITE_OTHER): Payer: BC Managed Care – PPO

## 2023-03-06 DIAGNOSIS — Z6841 Body Mass Index (BMI) 40.0 and over, adult: Secondary | ICD-10-CM | POA: Diagnosis not present

## 2023-03-06 DIAGNOSIS — I10 Essential (primary) hypertension: Secondary | ICD-10-CM | POA: Diagnosis not present

## 2023-03-06 DIAGNOSIS — E66813 Obesity, class 3: Secondary | ICD-10-CM | POA: Diagnosis not present

## 2023-03-06 DIAGNOSIS — Z833 Family history of diabetes mellitus: Secondary | ICD-10-CM | POA: Diagnosis not present

## 2023-03-07 LAB — COMPREHENSIVE METABOLIC PANEL
ALT: 24 U/L (ref 0–53)
AST: 23 U/L (ref 0–37)
Albumin: 4.6 g/dL (ref 3.5–5.2)
Alkaline Phosphatase: 82 U/L (ref 39–117)
BUN: 13 mg/dL (ref 6–23)
CO2: 29 meq/L (ref 19–32)
Calcium: 9.4 mg/dL (ref 8.4–10.5)
Chloride: 99 meq/L (ref 96–112)
Creatinine, Ser: 0.95 mg/dL (ref 0.40–1.50)
GFR: 100.95 mL/min (ref >60.00)
Glucose, Bld: 76 mg/dL (ref 70–99)
Potassium: 4.5 meq/L (ref 3.5–5.1)
Sodium: 138 meq/L (ref 135–145)
Total Bilirubin: 0.6 mg/dL (ref 0.2–1.2)
Total Protein: 7.4 g/dL (ref 6.0–8.3)

## 2023-03-07 LAB — LIPID PANEL
Cholesterol: 148 mg/dL (ref 0–200)
HDL: 37.6 mg/dL — ABNORMAL LOW (ref >39.00)
LDL Cholesterol: 79 mg/dL (ref 0–99)
NonHDL: 110.12
Total CHOL/HDL Ratio: 4
Triglycerides: 156 mg/dL — ABNORMAL HIGH (ref 0.0–149.0)
VLDL: 31.2 mg/dL (ref 0.0–40.0)

## 2023-03-07 LAB — CBC
HCT: 43.3 % (ref 39.0–52.0)
Hemoglobin: 14 g/dL (ref 13.0–17.0)
MCHC: 32.4 g/dL (ref 30.0–36.0)
MCV: 81.9 fL (ref 78.0–100.0)
Platelets: 284 10*3/uL (ref 150.0–400.0)
RBC: 5.28 Mil/uL (ref 4.22–5.81)
RDW: 13.8 % (ref 11.5–15.5)
WBC: 10.7 10*3/uL — ABNORMAL HIGH (ref 4.0–10.5)

## 2023-03-07 LAB — HEMOGLOBIN A1C: Hgb A1c MFr Bld: 6.1 % (ref 4.6–6.5)

## 2023-03-10 IMAGING — US US EXTREM LOW VENOUS*L*
1 series · 13 of 24 positions shown · non-contrast
Comparison: None.

CLINICAL DATA: Pain swelling and erythema

EXAM:
Left LOWER EXTREMITY VENOUS DOPPLER ULTRASOUND
TECHNIQUE: Gray-scale sonography with compression, as well as color and duplex
ultrasound, were performed to evaluate the deep venous system(s)
from the level of the common femoral vein through the popliteal and
proximal calf veins.

[Series 1: us venous img lower uni left (dvt) · portal-venous · 13 of 33 slices shown]
[im 1/33]
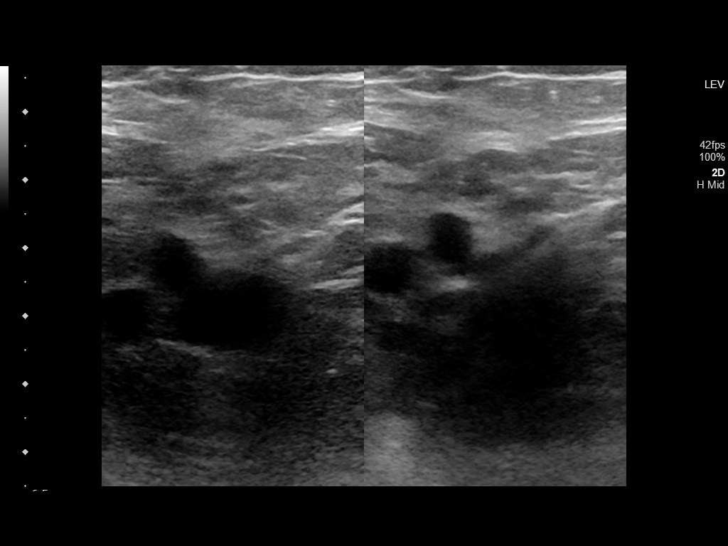
[im 3/33]
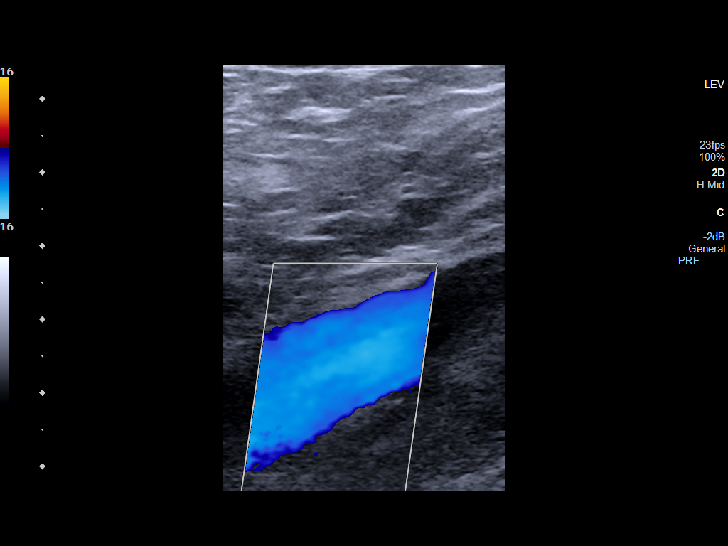
[im 6/33]
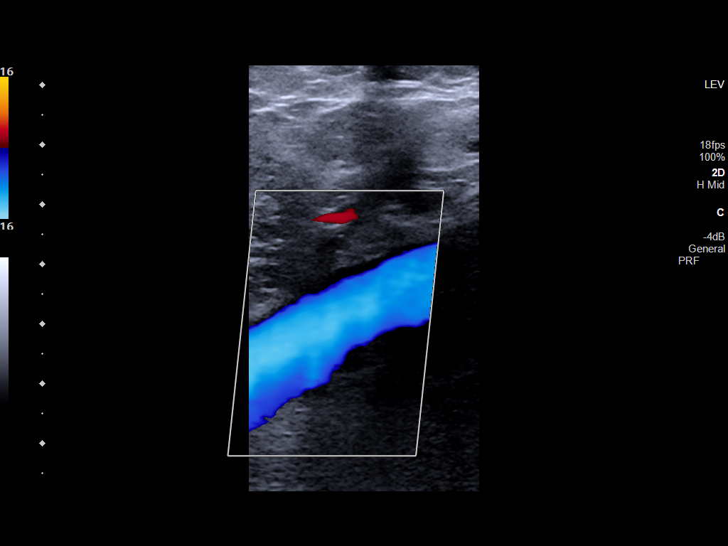
[im 9/33]
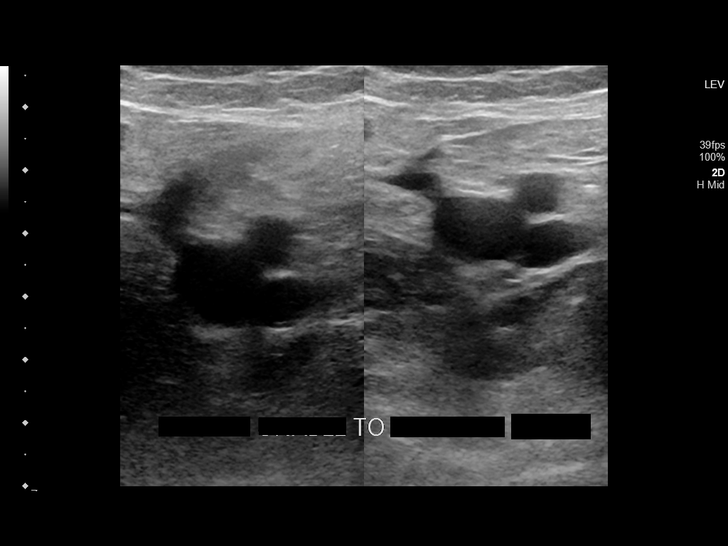
[im 12/33]
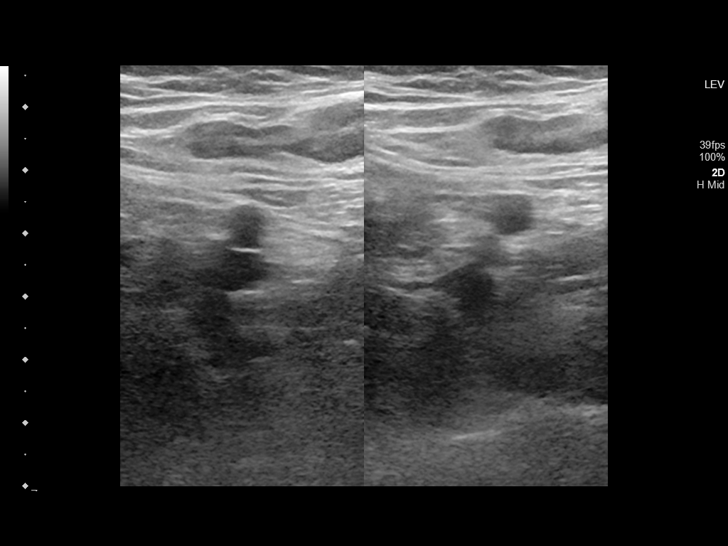
[im 14/33]
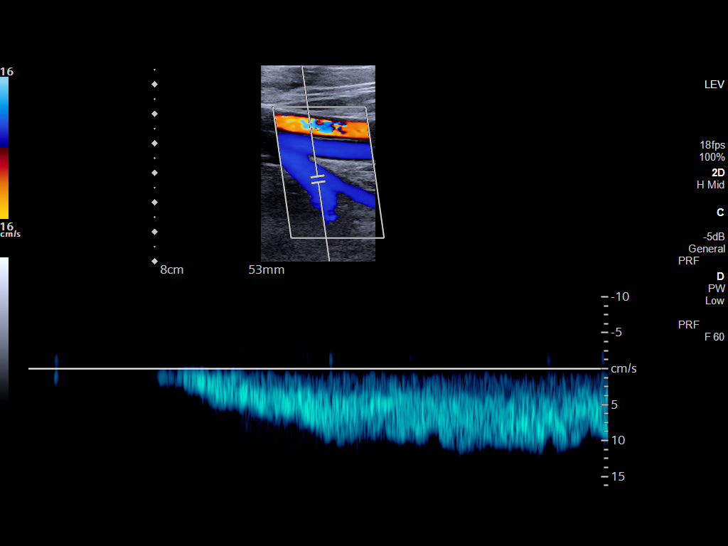
[im 17/33]
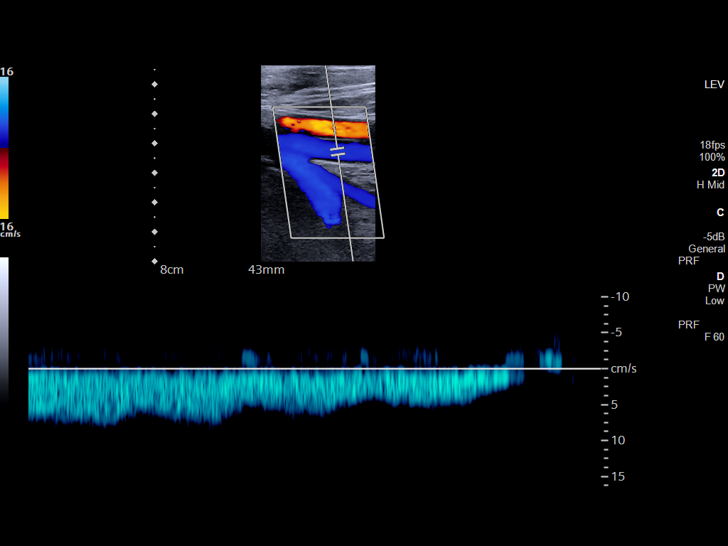
[im 19/33]
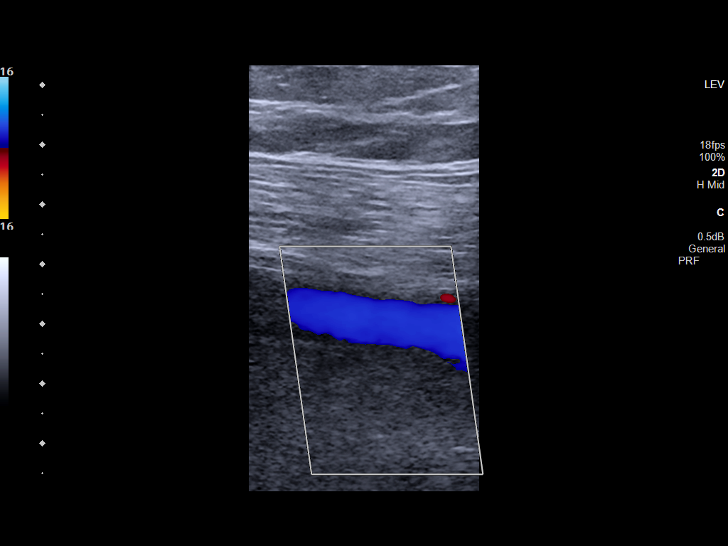
[im 21/33]
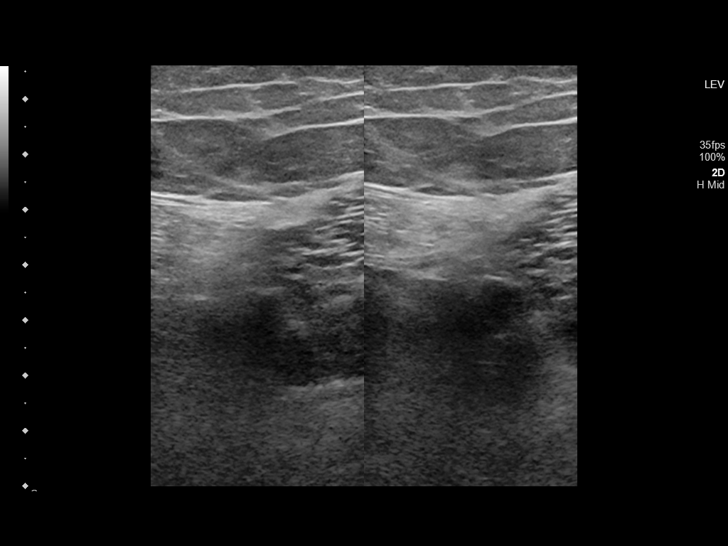
[im 24/33]
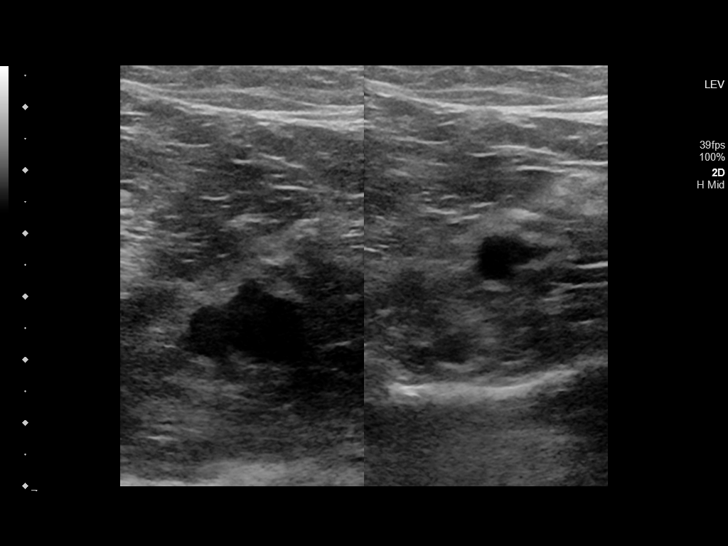
[im 27/33]
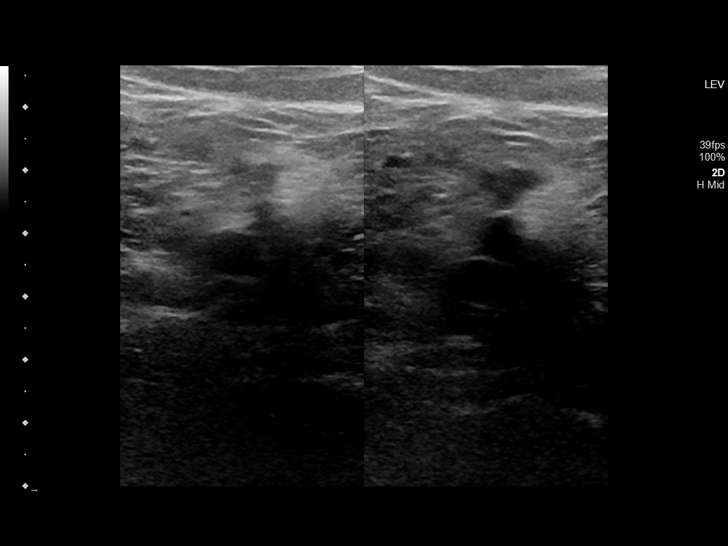
[im 30/33]
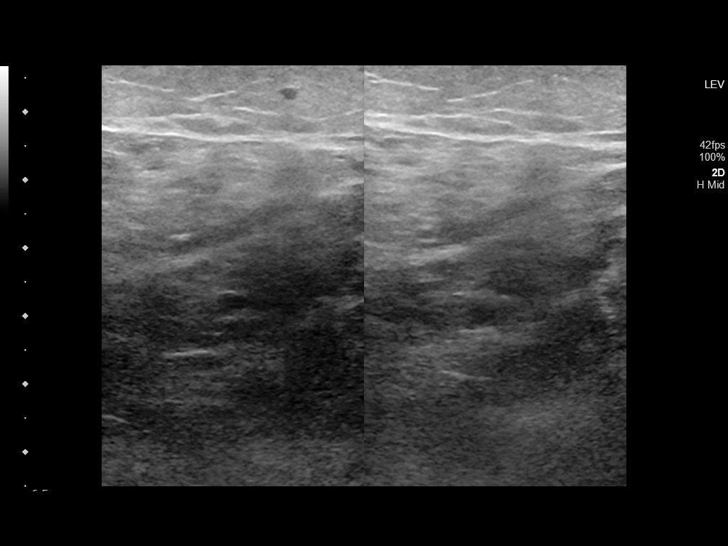
[im 33/33]
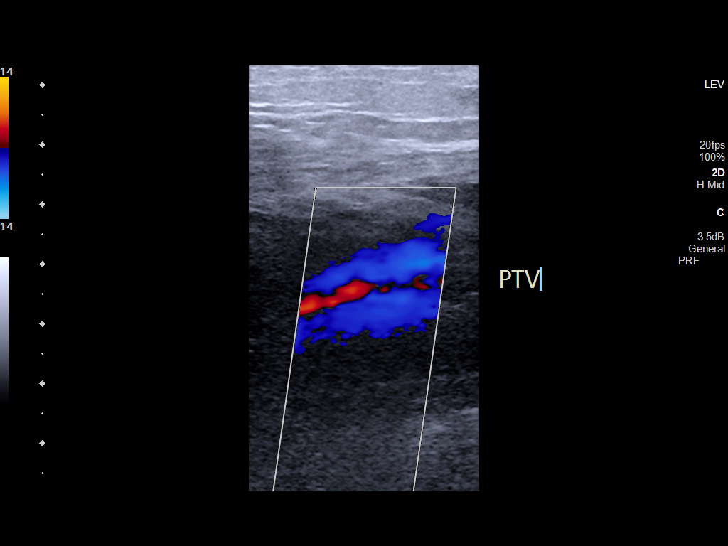

[13 of 24 positions shown; findings below may reference images not displayed]

FINDINGS: VENOUS

Normal compressibility of the popliteal veins, as well as the
visualized calf veins. Patient was unable to tolerate compression of
the left common femoral vein, profundal, femoral vein, or
saphenofemoral junction. Visualized portions of profunda femoral
vein and great saphenous vein unremarkable. No filling defects to
suggest DVT on grayscale or color Doppler imaging. Doppler waveforms
show normal direction of venous flow, normal respiratory plasticity
and response to augmentation.

Limited views of the contralateral common femoral vein are
unremarkable.

OTHER

None.

Limitations: none
IMPRESSION: 1. Limited study secondary to patient inability to tolerate
compression from the left common femoral vein to the popliteal vein,
however no gross filling defects are seen.
2. No convincing evidence for acute left lower extremity DVT

## 2023-03-17 NOTE — Progress Notes (Deleted)
Established Patient Office Visit  Subjective   Patient ID: Julian Franklin, male    DOB: [birth year]  Age: 37 y.o. MRN: [id number]  No chief complaint on file.   HPI  Julian Franklin is a 37 year old male with past medical history of HTN, asthma, presents today for a follow up on hypertension.   He was evaluated two weeks ago for history of hypertension, he reported at the time that he used to take hydrochlorothiazide but has not taken any in the last one year. He was asked to monitor his blood pressure readings and send them in one week. Unfortunately, he was not able to,   Today he reports,    Prediabets- hemeglobin A1C on 03/03/23- 6.1   Patient Active Problem List   Diagnosis Date Noted   Mild intermittent asthma without complication 03/03/2023   Encounter for screening and preventative care 03/03/2023   Essential (primary) hypertension 03/03/2023   Family history of diabetes mellitus 03/03/2023   Class 3 severe obesity due to excess calories with body mass index (BMI) of 40.0 to 44.9 in adult [facility]) 03/03/2023   Past Medical History:  Diagnosis Date   Asthma    Hypertension    Allergies  Allergen Reactions   Ceclor [Cefaclor] Hives         03/03/2023    2:57 PM  Depression screen PHQ 2/9  Decreased Interest 0  Down, Depressed, Hopeless 0  PHQ - 2 Score 0  Altered sleeping 0  Tired, decreased energy 0  Change in appetite 0  Feeling bad or failure about yourself  0  Trouble concentrating 0  Moving slowly or fidgety/restless 0  Suicidal thoughts 0  PHQ-9 Score 0  Difficult doing work/chores Not difficult at all       03/03/2023    2:57 PM  GAD 7 : Generalized Anxiety Score  Nervous, Anxious, on Edge 0  Control/stop worrying 0  Worry too much - different things 0  Trouble relaxing 0  Restless 0  Easily annoyed or irritable 0  Afraid - awful might happen 0  Total GAD 7 Score 0  Anxiety Difficulty Not difficult at all      ROS    Objective:      There were no vitals taken for this visit. BP Readings from Last 3 Encounters:  03/03/23 120/84  12/14/22 (!) 142/98  05/30/22 (!) 145/101   Wt Readings from Last 3 Encounters:  03/03/23 280 lb (127 kg)  05/30/22 200 lb 9.9 oz (91 kg)  05/21/22 200 lb (90.7 kg)      Physical Exam   No results found for any visits on 03/20/23.  Last metabolic panel Lab Results  Component Value Date   GLUCOSE 76 03/06/2023   NA 138 03/06/2023   K 4.5 03/06/2023   CL 99 03/06/2023   CO2 29 03/06/2023   BUN 13 03/06/2023   CREATININE 0.95 03/06/2023   GFR 100.95 03/06/2023   CALCIUM 9.4 03/06/2023   PROT 7.4 03/06/2023   ALBUMIN 4.6 03/06/2023   BILITOT 0.6 03/06/2023   ALKPHOS 82 03/06/2023   AST 23 03/06/2023   ALT 24 03/06/2023   ANIONGAP 8 12/14/2022   Last hemoglobin A1c Lab Results  Component Value Date   HGBA1C 6.1 03/06/2023      The ASCVD Risk score (Arnett DK, et al., 2019) failed to calculate for the following reasons:   The 2019 ASCVD risk score is only valid for ages 42 to 23  Assessment & Plan:  There are no diagnoses linked to this encounter.   No follow-ups on file.    Modesto Charon, NP

## 2023-03-20 ENCOUNTER — Ambulatory Visit: Payer: BC Managed Care – PPO | Admitting: General Practice

## 2023-03-27 ENCOUNTER — Encounter: Payer: Self-pay | Admitting: General Practice

## 2023-03-27 ENCOUNTER — Ambulatory Visit (INDEPENDENT_AMBULATORY_CARE_PROVIDER_SITE_OTHER): Payer: BC Managed Care – PPO | Admitting: General Practice

## 2023-03-27 VITALS — BP 128/88 | HR 114 | Temp 98.0°F | Ht 66.5 in | Wt 285.0 lb

## 2023-03-27 DIAGNOSIS — R202 Paresthesia of skin: Secondary | ICD-10-CM | POA: Diagnosis not present

## 2023-03-27 DIAGNOSIS — Z1159 Encounter for screening for other viral diseases: Secondary | ICD-10-CM

## 2023-03-27 DIAGNOSIS — R200 Anesthesia of skin: Secondary | ICD-10-CM

## 2023-03-27 DIAGNOSIS — I10 Essential (primary) hypertension: Secondary | ICD-10-CM | POA: Diagnosis not present

## 2023-03-27 DIAGNOSIS — H6993 Unspecified Eustachian tube disorder, bilateral: Secondary | ICD-10-CM | POA: Diagnosis not present

## 2023-03-27 MED ORDER — FLUTICASONE PROPIONATE 50 MCG/ACT NA SUSP: 2.0000 | Freq: Every day | NASAL | 0 refills | Status: DC

## 2023-03-27 NOTE — Assessment & Plan Note (Addendum)
Controlled.  Continue off meds.   Discussed the importance of monitoring diet, low sodium intake and exercise.   Follow up in 6 months.

## 2023-03-27 NOTE — Assessment & Plan Note (Signed)
Suspect the symptoms could be relate to the repetitive use of the arms at work or pinched nerve.   Exam stable today.   Vitamin b12 and vitamin d pending.   He would like to wait on the neurology referral.

## 2023-03-27 NOTE — Assessment & Plan Note (Signed)
Discharge and fluid noticed behind right ear.   Trial of Flonase and PO Claritin to see if it helps the symptoms.   Referral placed for ENT.

## 2023-03-27 NOTE — Progress Notes (Signed)
Established Patient Office Visit  Subjective   Patient ID: Julian Franklin, male    DOB: 1985/08/30  Age: 37 y.o. MRN: [id number]  Chief Complaint  Patient presents with   2 Week Follow-up BP    Has been checking BP with his watch. Averaging 120s/80s.    HPI  HTN: blood pressures radings are better. He has been monitoring them at home and the readings range between 120s/130-70s-80s. He denies any chest pain, shortness of breath, blurred vision or  unilateral weakness.   Numbness and tingling: started a few years ago. After holding something for long periods of time, he notices that his arms get numbness and tingling from elbow down. He denies any pain or swelling. 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    Ear drainage: started many years ago. He notices that the drainage is green in color. He works around loud noises and has taken hard wax out of his ear. He wears ear plugs. 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 He has had many ear tube surgeries and reconstructive surgery on +/his ears. He was established with an ENT many years ago and would like to establish with one again.   Patient Active Problem List   Diagnosis Date Noted   Eustachian tube dysfunction, bilateral 03/27/2023   Numbness and tingling of both upper extremities 03/27/2023   Mild intermittent asthma without complication 03/03/2023   Encounter for screening and preventative care 03/03/2023   Essential (primary) hypertension 03/03/2023   Family history of diabetes mellitus 03/03/2023   Class 3 severe obesity due to excess calories with body mass index (BMI) of 40.0 to 44.9 in adult [facility]) 03/03/2023   Past Medical History:  Diagnosis Date   Asthma    Hypertension    Allergies  Allergen Reactions   Ceclor [Cefaclor] Hives         03/03/2023    2:57 PM  Depression screen PHQ 2/9   Decreased Interest 0  Down, Depressed, Hopeless 0  PHQ - 2 Score 0  Altered sleeping 0  Tired, decreased energy 0  Change in appetite 0  Feeling bad or failure about yourself  0  Trouble concentrating 0  Moving slowly or fidgety/restless 0  Suicidal thoughts 0  PHQ-9 Score 0  Difficult doing work/chores Not difficult at all       03/03/2023    2:57 PM  GAD 7 : Generalized Anxiety Score  Nervous, Anxious, on Edge 0  Control/stop worrying 0  Worry too much - different things 0  Trouble relaxing 0  Restless 0  Easily annoyed or irritable 0  Afraid - awful might happen 0  Total GAD 7 Score 0  Anxiety Difficulty Not difficult at all      Review of Systems  Constitutional:  Negative for chills and fever.  HENT:  Positive for ear discharge. Negative for congestion, ear pain and sinus pain.        Bilateral  Respiratory:  Negative for shortness of breath and stridor.   Cardiovascular:  Negative for chest pain.  Gastrointestinal:  Negative for abdominal pain, constipation, diarrhea, heartburn, nausea and vomiting.  Genitourinary:  Negative for dysuria, frequency and urgency.  Neurological:  Positive for tingling. Negative for dizziness, tremors, speech change, focal weakness and headaches.  Endo/Heme/Allergies:  Negative for polydipsia.  Psychiatric/Behavioral:  Negative for  depression and suicidal ideas. The patient is not nervous/anxious.       Objective:     BP 128/88 (BP Location: Right Arm, Patient Position: Sitting, Cuff Size: Large)   Pulse (!) 114   Temp 98 F (36.7 C) (Oral)   Ht 5' 6.5" (1.689 m)   Wt 285 lb (129.3 kg)   SpO2 96%   BMI 45.31 kg/m  BP Readings from Last 3 Encounters:  03/27/23 128/88  03/03/23 120/84  12/14/22 (!) 142/98   Wt Readings from Last 3 Encounters:  03/27/23 285 lb (129.3 kg)  03/03/23 280 lb (127 kg)  05/30/22 200 lb 9.9 oz (91 kg)      Physical Exam Vitals and nursing note reviewed.  Constitutional:      Appearance:  Normal appearance.  HENT:     Right Ear: Tympanic membrane, ear canal and external ear normal.     Left Ear: Tympanic membrane, ear canal and external ear normal.     Ears:     Comments: Discharge present in right ear. Cardiovascular:     Rate and Rhythm: Normal rate and regular rhythm.     Pulses: Normal pulses.          Radial pulses are 2+ on the right side and 2+ on the left side.     Heart sounds: Normal heart sounds.  Pulmonary:     Effort: Pulmonary effort is normal.     Breath sounds: Normal breath sounds.  Musculoskeletal:     Right upper arm: Normal. No swelling, edema, lacerations, tenderness or bony tenderness.     Left upper arm: Normal. No swelling, edema, deformity, lacerations, tenderness or bony tenderness.     Right elbow: Normal. No swelling, deformity, effusion or lacerations. Normal range of motion. No tenderness.     Left elbow: Normal. No swelling, deformity, effusion or lacerations. Normal range of motion. No tenderness.     Right forearm: Normal.     Left forearm: Normal.     Right wrist: Normal.     Left wrist: Normal.     Right hand: Normal.     Left hand: Normal.  Neurological:     Mental Status: He is alert and oriented to person, place, and time.  Psychiatric:        Mood and Affect: Mood normal.        Behavior: Behavior normal.        Thought Content: Thought content normal.        Judgment: Judgment normal.      No results found for any visits on 03/27/23.  Last CBC Lab Results  Component Value Date   WBC 10.7 (H) 03/06/2023   HGB 14.0 03/06/2023   HCT 43.3 03/06/2023   MCV 81.9 03/06/2023   MCH 26.0 12/14/2022   RDW 13.8 03/06/2023   PLT 284.0 03/06/2023   Last metabolic panel Lab Results  Component Value Date   GLUCOSE 76 03/06/2023   NA 138 03/06/2023   K 4.5 03/06/2023   CL 99 03/06/2023   CO2 29 03/06/2023   BUN 13 03/06/2023   CREATININE 0.95 03/06/2023   GFR 100.95 03/06/2023   CALCIUM 9.4 03/06/2023   PROT 7.4  03/06/2023   ALBUMIN 4.6 03/06/2023   BILITOT 0.6 03/06/2023   ALKPHOS 82 03/06/2023   AST 23 03/06/2023   ALT 24 03/06/2023   ANIONGAP 8 12/14/2022   Last hemoglobin A1c Lab Results  Component Value Date   HGBA1C 6.1 03/06/2023  Last vitamin B12 and Folate No results found for: "VITAMINB12", "FOLATE"    The ASCVD Risk score (Arnett DK, et al., 2019) failed to calculate for the following reasons:   The 2019 ASCVD risk score is only valid for ages 3 to 68    Assessment & Plan:  Eustachian tube dysfunction, bilateral Assessment & Plan: Discharge and fluid noticed behind right ear.   Trial of Flonase and PO Claritin to see if it helps the symptoms.   Referral placed for ENT.   Orders: -     Fluticasone Propionate; Place 2 sprays into both nostrils daily.  Dispense: 16 g; Refill: 0 -     Ambulatory referral to ENT  Numbness and tingling of both upper extremities Assessment & Plan: Suspect the symptoms could be relate to the repetitive use of the arms at work or pinched nerve.   Exam stable today.   Vitamin b12 and vitamin d pending.   He would like to wait on the neurology referral.   Orders: -     Vitamin B12 -     VITAMIN D 25 Hydroxy (Vit-D Deficiency, Fractures)  Need for hepatitis C screening test -     Hepatitis C antibody  Essential (primary) hypertension Assessment & Plan: Controlled.  Continue off meds.   Discussed the importance of monitoring diet, low sodium intake and exercise.   Follow up in 6 months.      Return in about 6 months (around 09/25/2023) for HTN.    Modesto Charon, NP

## 2023-03-27 NOTE — Patient Instructions (Addendum)
Stop by the lab prior to leaving today. I will notify you of your results once received.   Try using Flonase (fluticasone) nasal spray. Instill 1 spray in each nostril twice daily.    You will either be contacted via phone regarding your referral to ENT , or you may receive a letter on your MyChart portal from our referral team with instructions for scheduling an appointment. Please let us know if you have not been contacted by anyone within two weeks.  It was a pleasure to see you today!

## 2023-03-28 LAB — VITAMIN D 25 HYDROXY (VIT D DEFICIENCY, FRACTURES): VITD: 12.44 ng/mL — ABNORMAL LOW (ref 30.00–100.00)

## 2023-03-28 LAB — HEPATITIS C ANTIBODY: Hepatitis C Ab: NONREACTIVE

## 2023-03-28 LAB — VITAMIN B12: Vitamin B-12: 247 pg/mL (ref 211–911)

## 2023-03-29 ENCOUNTER — Encounter: Payer: Self-pay | Admitting: General Practice

## 2023-03-29 DIAGNOSIS — E559 Vitamin D deficiency, unspecified: Secondary | ICD-10-CM

## 2023-03-29 DIAGNOSIS — Z419 Encounter for procedure for purposes other than remedying health state, unspecified: Secondary | ICD-10-CM | POA: Diagnosis not present

## 2023-03-30 MED ORDER — VITAMIN D (ERGOCALCIFEROL) 1.25 MG (50000 UNIT) PO CAPS: 50000.0000 [IU] | ORAL_CAPSULE | ORAL | 0 refills | Status: DC

## 2023-03-31 ENCOUNTER — Encounter: Payer: Self-pay | Admitting: General Practice

## 2023-04-19 ENCOUNTER — Other Ambulatory Visit: Payer: Self-pay | Admitting: General Practice

## 2023-04-19 DIAGNOSIS — H6993 Unspecified Eustachian tube disorder, bilateral: Secondary | ICD-10-CM

## 2023-04-29 DIAGNOSIS — Z419 Encounter for procedure for purposes other than remedying health state, unspecified: Secondary | ICD-10-CM | POA: Diagnosis not present

## 2023-05-11 ENCOUNTER — Telehealth (INDEPENDENT_AMBULATORY_CARE_PROVIDER_SITE_OTHER): Payer: Self-pay | Admitting: Otolaryngology

## 2023-05-11 NOTE — Telephone Encounter (Signed)
Left vm to confirm appt and address for 05/12/2023.

## 2023-05-12 ENCOUNTER — Ambulatory Visit (INDEPENDENT_AMBULATORY_CARE_PROVIDER_SITE_OTHER): Payer: BC Managed Care – PPO | Admitting: Audiology

## 2023-05-12 ENCOUNTER — Institutional Professional Consult (permissible substitution) (INDEPENDENT_AMBULATORY_CARE_PROVIDER_SITE_OTHER): Payer: BC Managed Care – PPO

## 2023-05-27 DIAGNOSIS — Z419 Encounter for procedure for purposes other than remedying health state, unspecified: Secondary | ICD-10-CM | POA: Diagnosis not present

## 2023-06-30 ENCOUNTER — Encounter: Payer: Self-pay | Admitting: General Practice

## 2023-06-30 ENCOUNTER — Ambulatory Visit (INDEPENDENT_AMBULATORY_CARE_PROVIDER_SITE_OTHER): Admitting: General Practice

## 2023-06-30 VITALS — BP 130/88 | HR 108 | Temp 98.7°F | Ht 66.5 in | Wt 299.0 lb

## 2023-06-30 DIAGNOSIS — R7303 Prediabetes: Secondary | ICD-10-CM | POA: Insufficient documentation

## 2023-06-30 DIAGNOSIS — G4733 Obstructive sleep apnea (adult) (pediatric): Secondary | ICD-10-CM

## 2023-06-30 DIAGNOSIS — K219 Gastro-esophageal reflux disease without esophagitis: Secondary | ICD-10-CM | POA: Diagnosis not present

## 2023-06-30 DIAGNOSIS — I10 Essential (primary) hypertension: Secondary | ICD-10-CM | POA: Diagnosis not present

## 2023-06-30 DIAGNOSIS — F410 Panic disorder [episodic paroxysmal anxiety] without agoraphobia: Secondary | ICD-10-CM | POA: Insufficient documentation

## 2023-06-30 DIAGNOSIS — E66813 Obesity, class 3: Secondary | ICD-10-CM | POA: Diagnosis not present

## 2023-06-30 DIAGNOSIS — F32A Depression, unspecified: Secondary | ICD-10-CM

## 2023-06-30 DIAGNOSIS — E781 Pure hyperglyceridemia: Secondary | ICD-10-CM

## 2023-06-30 DIAGNOSIS — Z6841 Body Mass Index (BMI) 40.0 and over, adult: Secondary | ICD-10-CM

## 2023-06-30 MED ORDER — FAMOTIDINE 20 MG PO TABS: 20.0000 mg | ORAL_TABLET | Freq: Every day | ORAL | 1 refills | Status: DC

## 2023-06-30 MED ORDER — TIRZEPATIDE-WEIGHT MANAGEMENT 2.5 MG/0.5ML ~~LOC~~ SOAJ: 2.5000 mg | SUBCUTANEOUS | 0 refills | Status: DC

## 2023-06-30 MED ORDER — SERTRALINE HCL 50 MG PO TABS: 50.0000 mg | ORAL_TABLET | Freq: Every day | ORAL | 0 refills | Status: DC

## 2023-06-30 NOTE — Assessment & Plan Note (Addendum)
 Labs show high end of prediabetes.  He is at increased risk of developing type 2 diabetes given his other co-moribidities.   Discussed treatment options; agreeable to start GLP-1.   Start Start tirzepitide Greggory Keen) for diabetes/weight loss. Start by injecting 2.5 mg into the skin once weekly for 4 weeks. Rx sent. Discussed side effects, handout given. Follow up in four weeks.

## 2023-06-30 NOTE — Patient Instructions (Signed)
 Start tirzepitide (zepbound) for diabetes/weight loss. Start by injecting 2.5 mg into the skin once weekly for 4 weeks. Please notify me once you've used your last 2.5 mg pen so that I can prescribe the next dose.   Start Famotidine 20 mg once daily before breakfast.  Start Zoloft 25 mg once daily for one week and then increase to 50 mg once daily at bedtime.  Follow up in four weeks.   It was a pleasure to see you today!

## 2023-06-30 NOTE — Assessment & Plan Note (Signed)
 Uncontrolled.   Agreeable to start antidepressant.   Start Zoloft 25 mg once daily for seven days and then increase to 50 mg once daily thereafter. Discussed side effects. Hand out given.  Follow up in 4 weeks.

## 2023-06-30 NOTE — Assessment & Plan Note (Signed)
 Hemoglobin A1c- 6.1  Discussed the importance of monitoring diet and exercise.   Start GLP-1 once weekly to help with weight loss and lowering A1c.

## 2023-06-30 NOTE — Assessment & Plan Note (Signed)
 Lipid panel shows hypertriglyceridemia.   Discussed treatment options. At this time, he would like to start GLP-1 to help with weight loss.   Weight loss will help in lowering triglycerides.  Recheck lipid panel in 6 months.

## 2023-06-30 NOTE — Assessment & Plan Note (Signed)
 Slightly elevated today.   Discussed the importance of DASH diet.   As we are starting GLP-1; discussed that weight loss should help lowering blood pressure.

## 2023-06-30 NOTE — Assessment & Plan Note (Signed)
 Chronic.   Start Famotidine 20 mg once daily. Rx sent.

## 2023-06-30 NOTE — Progress Notes (Signed)
 Established Patient Office Visit  Subjective   Patient ID: Kiley Torrence, male    DOB: 1986-02-14  Age: 38 y.o. MRN: [id number]  Chief Complaint  Patient presents with   Obesity    Wants to discuss medication options   Depression    Has had a lot depression since trying to loose weight. Did try diet in the past but got too expensive to continue.     HPI  Alvar Malinoski is a 38 year old male with past medical history of HTN, asthma, obesity presents today for a follow up to discuss obesity.   Obesity- chronic. Has tried many different diets and exercise programs and has been unsuccessful. He has been worried due to worsening comorbidities that he has developed secondary to obesity. He has been diagnosed prediabetes, hypertriglycerdemia, hypertension, obstructive sleep apnea He understands that he is at a high risk for heart disease, stroke or MI. He is interested in trying GLP1 to see if that will help with his weight loss. He reports that he eats a light breakfast around 9:30 am and then lunch around 12:30/1 pm and then dinner. He denies any history of pancreatitis or family history of any thyroid cancer.   Gerd: chronic. Usually worse with certain foods. Has tried Tums but feels like he may need something stronger. Denies any nausea, vomiting, diarrhea or constipation.    Depression: has been getting worse since he has not been successful with his weight loss. This has been causing him to feel tired or have little energy and has affected his diet. He feels bad about himself and feels that he has let himself or his family down. He denies any SI/HI. He would like to try antidepressant.   Patient Active Problem List   Diagnosis Date Noted   Panic attack 06/30/2023   Gastroesophageal reflux disease 06/30/2023   Prediabetes 06/30/2023   Obstructive sleep apnea 06/30/2023   Hypertriglyceridemia 06/30/2023   Depression 06/30/2023   Eustachian tube dysfunction, bilateral 03/27/2023    Numbness and tingling of both upper extremities 03/27/2023   Mild intermittent asthma without complication 03/03/2023   Encounter for screening and preventative care 03/03/2023   Essential (primary) hypertension 03/03/2023   Family history of diabetes mellitus 03/03/2023   Class 3 severe obesity due to excess calories with body mass index (BMI) of 40.0 to 44.9 in adult [facility]) 03/03/2023   Past Medical History:  Diagnosis Date   Asthma    Hypertension    Past Surgical History:  Procedure Laterality Date   APPENDECTOMY     INNER EAR SURGERY     Allergies  Allergen Reactions   Ceclor [Cefaclor] Hives         06/30/2023    3:34 PM 03/03/2023    2:57 PM  Depression screen PHQ 2/9  Decreased Interest 0 0  Down, Depressed, Hopeless 2 0  PHQ - 2 Score 2 0  Altered sleeping 1 0  Tired, decreased energy 1 0  Change in appetite 2 0  Feeling bad or failure about yourself  2 0  Trouble concentrating 0 0  Moving slowly or fidgety/restless 0 0  Suicidal thoughts 0 0  PHQ-9 Score 8 0  Difficult doing work/chores Somewhat difficult Not difficult at all       06/30/2023    3:35 PM 03/03/2023    2:57 PM  GAD 7 : Generalized Anxiety Score  Nervous, Anxious, on Edge 0 0  Control/stop worrying 0 0  Worry too much -  different things 0 0  Trouble relaxing 0 0  Restless 0 0  Easily annoyed or irritable 1 0  Afraid - awful might happen 1 0  Total GAD 7 Score 2 0  Anxiety Difficulty Somewhat difficult Not difficult at all      Review of Systems  Constitutional:  Negative for chills and fever.  Respiratory:  Negative for shortness of breath.   Cardiovascular:  Negative for chest pain.  Gastrointestinal:  Positive for heartburn. Negative for abdominal pain, constipation, diarrhea, nausea and vomiting.  Genitourinary:  Negative for dysuria, frequency and urgency.  Neurological:  Negative for dizziness and headaches.  Endo/Heme/Allergies:  Negative for polydipsia.   Psychiatric/Behavioral:  Positive for depression. Negative for suicidal ideas. The patient is not nervous/anxious.       Objective:     BP 130/88 (BP Location: Left Arm, Patient Position: Sitting, Cuff Size: Large)   Pulse (!) 108   Temp 98.7 F (37.1 C) (Oral)   Ht 5' 6.5" (1.689 m)   Wt 299 lb (135.6 kg)   SpO2 95%   BMI 47.54 kg/m  BP Readings from Last 3 Encounters:  06/30/23 130/88  03/27/23 128/88  03/03/23 120/84   Wt Readings from Last 3 Encounters:  06/30/23 299 lb (135.6 kg)  03/27/23 285 lb (129.3 kg)  03/03/23 280 lb (127 kg)      Physical Exam Vitals and nursing note reviewed.  Constitutional:      Appearance: Normal appearance.  Cardiovascular:     Rate and Rhythm: Normal rate and regular rhythm.     Pulses: Normal pulses.     Heart sounds: Normal heart sounds.  Pulmonary:     Effort: Pulmonary effort is normal.     Breath sounds: Normal breath sounds.  Abdominal:     General: Abdomen is flat. Bowel sounds are normal.     Palpations: Abdomen is soft.  Skin:    General: Skin is warm.  Neurological:     Mental Status: He is alert and oriented to person, place, and time.  Psychiatric:        Mood and Affect: Mood normal.        Behavior: Behavior normal.        Thought Content: Thought content normal.        Judgment: Judgment normal.      No results found for any visits on 06/30/23.     The ASCVD Risk score (Arnett DK, et al., 2019) failed to calculate for the following reasons:   The 2019 ASCVD risk score is only valid for ages 102 to 60    Assessment & Plan:  Class 3 severe obesity due to excess calories with body mass index (BMI) of 40.0 to 44.9 in adult, unspecified whether serious comorbidity present [facility]) Assessment & Plan: Labs show high end of prediabetes.  He is at increased risk of developing type 2 diabetes given his other co-moribidities.   Discussed treatment options; agreeable to start GLP-1.   Start Start tirzepitide  Greggory Keen) for diabetes/weight loss. Start by injecting 2.5 mg into the skin once weekly for 4 weeks. Rx sent. Discussed side effects, handout given. Follow up in four weeks.   Orders: -     Tirzepatide-Weight Management; Inject 2.5 mg into the skin once a week.  Dispense: 2 mL; Refill: 0  Gastroesophageal reflux disease, unspecified whether esophagitis present Assessment & Plan: Chronic.   Start Famotidine 20 mg once daily. Rx sent.   Orders: -  Famotidine; Take 1 tablet (20 mg total) by mouth daily.  Dispense: 30 tablet; Refill: 1  Prediabetes Assessment & Plan: Hemoglobin A1c- 6.1  Discussed the importance of monitoring diet and exercise.   Start GLP-1 once weekly to help with weight loss and lowering A1c.  Orders: -     Tirzepatide-Weight Management; Inject 2.5 mg into the skin once a week.  Dispense: 2 mL; Refill: 0  Essential (primary) hypertension Assessment & Plan: Slightly elevated today.   Discussed the importance of DASH diet.   As we are starting GLP-1; discussed that weight loss should help lowering blood pressure.   Orders: -     Tirzepatide-Weight Management; Inject 2.5 mg into the skin once a week.  Dispense: 2 mL; Refill: 0  Obstructive sleep apnea Assessment & Plan: Patient reported.  Chronic.  Has CPAP at home. Is not compliant.  Orders: -     Tirzepatide-Weight Management; Inject 2.5 mg into the skin once a week.  Dispense: 2 mL; Refill: 0  Hypertriglyceridemia Assessment & Plan: Lipid panel shows hypertriglyceridemia.   Discussed treatment options. At this time, he would like to start GLP-1 to help with weight loss.   Weight loss will help in lowering triglycerides.  Recheck lipid panel in 6 months.  Orders: -     Tirzepatide-Weight Management; Inject 2.5 mg into the skin once a week.  Dispense: 2 mL; Refill: 0  Depression, unspecified depression type Assessment & Plan: Uncontrolled.   Agreeable to start antidepressant.   Start  Zoloft 25 mg once daily for seven days and then increase to 50 mg once daily thereafter. Discussed side effects. Hand out given.  Follow up in 4 weeks.   Orders: -     Sertraline HCl; Take 1 tablet (50 mg total) by mouth at bedtime.  Dispense: 30 tablet; Refill: 0     Return in about 4 weeks (around 07/28/2023) for anxiety and weight loss.    [place] Charon, NP

## 2023-06-30 NOTE — Assessment & Plan Note (Signed)
 Patient reported.  Chronic.  Has CPAP at home. Is not compliant.

## 2023-07-04 ENCOUNTER — Telehealth: Payer: Self-pay

## 2023-07-04 ENCOUNTER — Other Ambulatory Visit (HOSPITAL_COMMUNITY): Payer: Self-pay

## 2023-07-04 NOTE — Telephone Encounter (Signed)
 Pharmacy Patient Advocate Encounter   Received notification from CoverMyMeds that prior authorization for Zepbound 2.5MG /0.5ML pen-injectors is required/requested.   Insurance verification completed.   The patient is insured through [facility] .   Per test claim: PA required; PA submitted to above mentioned insurance via CoverMyMeds Key/confirmation #/EOC [id number] Status is pending

## 2023-07-04 NOTE — Telephone Encounter (Signed)
 Pharmacy Patient Advocate Encounter  Received notification from [facility] that Prior Authorization for Zepbound 2.5MG /0.5ML pen-injectors has been APPROVED from 07/04/23 to 02/02/24. Ran test claim, Copay is $24.99. This test claim was processed through [facility]- copay amounts may vary at other pharmacies due to pharmacy/plan contracts, or as the patient moves through the different stages of their insurance plan.   PA #/Case ID/Reference #: [id number]

## 2023-07-05 ENCOUNTER — Encounter: Payer: Self-pay | Admitting: General Practice

## 2023-07-05 DIAGNOSIS — J4520 Mild intermittent asthma, uncomplicated: Secondary | ICD-10-CM

## 2023-07-05 DIAGNOSIS — K219 Gastro-esophageal reflux disease without esophagitis: Secondary | ICD-10-CM

## 2023-07-05 DIAGNOSIS — G4733 Obstructive sleep apnea (adult) (pediatric): Secondary | ICD-10-CM

## 2023-07-05 DIAGNOSIS — E66813 Obesity, class 3: Secondary | ICD-10-CM

## 2023-07-05 DIAGNOSIS — I10 Essential (primary) hypertension: Secondary | ICD-10-CM

## 2023-07-05 DIAGNOSIS — R7303 Prediabetes: Secondary | ICD-10-CM

## 2023-07-05 DIAGNOSIS — E781 Pure hyperglyceridemia: Secondary | ICD-10-CM

## 2023-07-07 ENCOUNTER — Telehealth: Payer: Self-pay

## 2023-07-07 ENCOUNTER — Other Ambulatory Visit (HOSPITAL_COMMUNITY): Payer: Self-pay

## 2023-07-07 NOTE — Telephone Encounter (Signed)
 Pharmacy Patient Advocate Encounter   Received notification from Patient Advice Request messages that prior authorization for Tirzepatide 2.5MG /0.5ML Pen (Zepbound) is required/requested.   PA has already been approved, ran test claim for Tirzepatide 2.5MG /0.5ML Pen (Zepbound), $24.99 with coupon, without, $1043.66, referred pt to manufacturer site for coupon

## 2023-07-07 NOTE — Telephone Encounter (Signed)
 PA has been approved, however, pt has a deductible that is being applied to med. There should be a manufacturer coupon that can bring medicine down to $24.99, [email or web address], hopefully this helps!

## 2023-07-08 ENCOUNTER — Other Ambulatory Visit: Payer: Self-pay | Admitting: General Practice

## 2023-07-08 DIAGNOSIS — E559 Vitamin D deficiency, unspecified: Secondary | ICD-10-CM

## 2023-07-08 DIAGNOSIS — Z419 Encounter for procedure for purposes other than remedying health state, unspecified: Secondary | ICD-10-CM | POA: Diagnosis not present

## 2023-07-10 NOTE — Telephone Encounter (Signed)
 Refill request for Vitamin D, Ergocalciferol, (DRISDOL) 1.25 MG (50000 UNIT) CAPS capsule   LOV - 06/30/23 Next OV - 07/28/23 Last refill - 03/30/23 #12/0 Last lab was 12.44 Next lab will be done 07/28/23

## 2023-07-13 MED ORDER — WEGOVY 0.25 MG/0.5ML ~~LOC~~ SOAJ: 0.2500 mg | SUBCUTANEOUS | 0 refills | Status: DC

## 2023-07-14 ENCOUNTER — Ambulatory Visit (INDEPENDENT_AMBULATORY_CARE_PROVIDER_SITE_OTHER): Payer: BC Managed Care – PPO | Admitting: Audiology

## 2023-07-14 ENCOUNTER — Institutional Professional Consult (permissible substitution) (INDEPENDENT_AMBULATORY_CARE_PROVIDER_SITE_OTHER): Payer: BC Managed Care – PPO

## 2023-07-17 ENCOUNTER — Other Ambulatory Visit (HOSPITAL_COMMUNITY): Payer: Self-pay

## 2023-07-17 ENCOUNTER — Telehealth: Payer: Self-pay

## 2023-07-17 NOTE — Telephone Encounter (Signed)
 Pharmacy Patient Advocate Encounter   Received notification from CoverMyMeds that prior authorization for Wegovy  0.25MG /0.5ML auto-injectors  is required/requested.   Insurance verification completed.   The patient is insured through [facility] .   Per test claim: PA required; PA started via CoverMyMeds. KEY [id number] . Prior Authorization form/request asks a question that requires your assistance. Please see the question below and advise accordingly. The PA will not be submitted until the necessary information is received. HAS PT AND HOW LONG HAS PT PARTICIPATED IN A LIFESTYLE MODIFICATION PROGRAM?

## 2023-07-18 NOTE — Telephone Encounter (Signed)
 Clinical questions have been answered and PA submitted. PA currently Pending.

## 2023-07-20 ENCOUNTER — Other Ambulatory Visit (HOSPITAL_COMMUNITY): Payer: Self-pay

## 2023-07-20 NOTE — Telephone Encounter (Signed)
 Patient just advised us  that he has medicaid and would like the zepbound  run thru his medicaid. Can we redo his PA?

## 2023-07-20 NOTE — Telephone Encounter (Signed)
 Pharmacy Patient Advocate Encounter  Received notification from [employer] that Prior Authorization for Wegovy  0.25MG /0.5ML auto-injectors has been DENIED.  MEDICAL DENIAL  No denial letter received via Fax or CMM. It has been requested and will be uploaded to the media tab once received.  (Key: [id number])

## 2023-07-20 NOTE — Telephone Encounter (Signed)
 Message has been responded to in another message

## 2023-07-20 NOTE — Telephone Encounter (Signed)
 Patient needs PA done thru [facility]

## 2023-07-20 NOTE — Telephone Encounter (Signed)
 PA request has been Submitted. New Encounter has been or will be created for follow up. For additional info see Pharmacy Prior Auth telephone encounter from 07/17/2023.

## 2023-07-21 ENCOUNTER — Other Ambulatory Visit (HOSPITAL_COMMUNITY): Payer: Self-pay

## 2023-07-21 ENCOUNTER — Telehealth: Payer: Self-pay

## 2023-07-21 NOTE — Telephone Encounter (Signed)
 Pharmacy Patient Advocate Encounter   Received notification from [employer] that prior authorization for Wegovy  0.25MG /0.5ML auto-injectors is required/requested.   Insurance verification completed.   The patient is insured through [facility] [place] .   Per test claim: PA required; PA submitted to above mentioned insurance via CoverMyMeds Key/confirmation #/EOC [id number] Status is pending

## 2023-07-21 NOTE — Telephone Encounter (Signed)
 Pharmacy Patient Advocate Encounter  Received notification from [facility] Medicaid that Prior Authorization for Wegovy  0.25MG /0.5ML auto-injectorshas been DENIED.  Full denial letter will be uploaded to the media tab. See denial reason below.  The following criteria from the health plan guideline, GLP1s for Weight Management, must be met before we can approve this request. Please send us  supporting chart notes and lab results.   The beneficiary is currently on and will continue lifestyle modification including structured nutrition and physical activity is not clinically appropriate at the time GLP1 therapy commences.  AND 2. The beneficiary will NOT be using the requested agent in combination with another GLP-1 receptor agonist agent.   PA #/Case ID/Reference #: [id number]

## 2023-07-21 NOTE — Telephone Encounter (Signed)
 Copied from CRM [id number]. Topic: General - Other >> Jul 21, 2023 12:05 PM Ovid Blow wrote: Reason for CRM: Precious from [facility] stated that patient Semaglutide -Weight Management (WEGOVY ) 0.25 MG/0.5ML SOAJ. Precious wants to know if patient is still dieting and excerising. Precious will fax over prior authorization paperwork

## 2023-07-24 ENCOUNTER — Other Ambulatory Visit (HOSPITAL_COMMUNITY): Payer: Self-pay

## 2023-07-25 ENCOUNTER — Other Ambulatory Visit (HOSPITAL_COMMUNITY): Payer: Self-pay

## 2023-07-25 NOTE — Telephone Encounter (Signed)
 Noted.

## 2023-07-25 NOTE — Telephone Encounter (Signed)
 PA has been submitted to pt's medicaid and has been denied, we are attempting an appeal and request has been routed to appeals pharmacist in separate encounter, signing off on this encounter, will followup in appeal encounter. Thank you.

## 2023-07-26 ENCOUNTER — Other Ambulatory Visit (HOSPITAL_COMMUNITY): Payer: Self-pay

## 2023-07-26 ENCOUNTER — Telehealth: Payer: Self-pay

## 2023-07-26 NOTE — Telephone Encounter (Signed)
 Apologies, this was actually in reference to Wegovy , PA for Zepbound  has been submitted to [facility] and documented in separate encounter. Thank you!

## 2023-07-26 NOTE — Telephone Encounter (Signed)
 Pharmacy Patient Advocate Encounter   Received notification from Pt Calls Messages that prior authorization for Zepbound  is required/requested.   Insurance verification completed.   The patient is insured through [facility] .   Per test claim: PA required and submitted KEY/EOC/Request #: [id number] DENIED.  Full denial letter will be uploaded to the media tab. See denial reason below.

## 2023-07-26 NOTE — Telephone Encounter (Signed)
 PA has been denied through medicaid, have routed to appeals [facility] to attempt an appeal.

## 2023-07-27 ENCOUNTER — Telehealth: Payer: Self-pay

## 2023-07-27 ENCOUNTER — Other Ambulatory Visit (HOSPITAL_COMMUNITY): Payer: Self-pay

## 2023-07-27 ENCOUNTER — Telehealth: Payer: Self-pay | Admitting: Pharmacist

## 2023-07-27 NOTE — Telephone Encounter (Signed)
 Hey, there's been a lot of back and forth between Wegovy  and Zepbound . Zepbound  has been approved through pt's primary insurance, but denied by pt's medicaid, Wegovy  was denied by medicaid, but I have submitted a PA attempt for Wegovy  to pt's primary insurance. If primary insurance approves Wegovy  use, we can explore getting the medicaid denial appealed. I have communicated this in the new Wegovy  PA to the primary insurance and will continue to update that encounter. Signing off on any duplicate encounters, leaving only pending decisions ones open. Thanks for your patience as we try all avenues :)

## 2023-07-27 NOTE — Telephone Encounter (Signed)
 Pharmacy Patient Advocate Encounter   Received notification from Pt Calls Messages that prior authorization for Wegovy  0.25 MG/0.5ML auto-injectors is required/requested.   Insurance verification completed.   The patient is insured through  [employer]  .   Per test claim: PA required; PA submitted to above mentioned insurance via CoverMyMeds Key/confirmation #/EOC [id number] Status is pending

## 2023-07-27 NOTE — Telephone Encounter (Signed)
 Insurance companies are becoming increasingly stricter about requiring thorough documentation of lifestyle modifications in the patient's chart at each visit. This includes detailed records of diet recommendations (caloric intake, etc), exercise plans (amount of time/wk, etc), and an emphasis on the patient's commitment to continuing these efforts while on medication.   Based on the current rejection:  1. The beneficiary is currently on and will continue lifestyle modification including structured nutrition and physical activity, unless physical activity is not clinically appropriate at the time GLP1 therapy commences.  In order to create an effective appeal, the above information would need to be added to the chart notes.  Please advise.  Thank you, Dene Fines, PharmD Clinical Pharmacist  [facility]  Direct Dial: [phone]

## 2023-07-27 NOTE — Telephone Encounter (Signed)
 Thank you so much you your help in all of this! Its all been confusing and a mess when the wrong insurance was entered for the patient.

## 2023-07-27 NOTE — Telephone Encounter (Signed)
 Was the wegovy  ran thru [place] also? When all the PA's were first started we did not know he had medicaid.

## 2023-07-27 NOTE — Telephone Encounter (Signed)
 Pharmacy Patient Advocate Encounter  Received notification from [employer] that Prior Authorization for Wegovy  has been APPROVED from 07/27/2023 to 01/26/2024. Ran test claim, Copay is $1,295.81. This test claim was processed through [facility]- copay amounts may vary at other pharmacies due to pharmacy/plan contracts, or as the patient moves through the different stages of their insurance plan.   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

## 2023-07-28 ENCOUNTER — Encounter: Payer: Self-pay | Admitting: General Practice

## 2023-07-28 ENCOUNTER — Ambulatory Visit (INDEPENDENT_AMBULATORY_CARE_PROVIDER_SITE_OTHER): Admitting: General Practice

## 2023-07-28 ENCOUNTER — Other Ambulatory Visit (HOSPITAL_COMMUNITY): Payer: Self-pay

## 2023-07-28 ENCOUNTER — Other Ambulatory Visit: Payer: Self-pay | Admitting: General Practice

## 2023-07-28 VITALS — BP 136/70 | HR 84 | Temp 98.2°F | Ht 66.5 in | Wt 294.0 lb

## 2023-07-28 DIAGNOSIS — F32A Depression, unspecified: Secondary | ICD-10-CM

## 2023-07-28 DIAGNOSIS — E559 Vitamin D deficiency, unspecified: Secondary | ICD-10-CM | POA: Diagnosis not present

## 2023-07-28 DIAGNOSIS — K219 Gastro-esophageal reflux disease without esophagitis: Secondary | ICD-10-CM

## 2023-07-28 DIAGNOSIS — Z6841 Body Mass Index (BMI) 40.0 and over, adult: Secondary | ICD-10-CM

## 2023-07-28 DIAGNOSIS — E66813 Obesity, class 3: Secondary | ICD-10-CM

## 2023-07-28 LAB — VITAMIN D 25 HYDROXY (VIT D DEFICIENCY, FRACTURES): Vit D, 25-Hydroxy: 21 ng/mL — ABNORMAL LOW (ref 30–100)

## 2023-07-28 NOTE — Progress Notes (Signed)
 Established Patient Office Visit  Subjective   Patient ID: Julian Franklin, male    DOB: 04-24-1985  Age: 38 y.o. MRN: [id number]  Chief Complaint  Patient presents with   Anxiety    Patient here today to f/u on anxiety    Obesity    Zepbound  and wegovy  both denied by insurance     HPI  Julian Franklin is a 38 year old male with past medical history of HTN, asthma, OSA, GERD, obesity, prediabetes, hypertriglyceridemia and depression presents today for a follow up.   Depression: was started on zoloft  25 mg but he just started it two days ago. In the past he was treated with zoloft  100 mg once daily. He states that it was helpful in the past. Denies any SI/HI.  Obesity: he was evaluated on 06/30/23 when he was given the rx of zepbound . His insurance company has denied. He started boot camp earlier this week. He has been monitoring his diet and eating more home cooked meals at home. He is avoiding sodas as well as fried foods. He is using his airfryer and eating more of those meals. He started exercising he is walking 4 miles a day at work and goes to [facility] on the weekends. Physically active at work.   Vitamin d  deficiency: He was evaluated on 03/27/23 when he was found to have vitamin D  deficiency. He was prescribed vitamin d  50,000 units to take once weekly for 12 weeks. He is due for a recheck.  Patient Active Problem List   Diagnosis Date Noted   Vitamin D  deficiency 07/28/2023   Panic attack 06/30/2023   Gastroesophageal reflux disease 06/30/2023   Prediabetes 06/30/2023   Obstructive sleep apnea 06/30/2023   Hypertriglyceridemia 06/30/2023   Depression 06/30/2023   Eustachian tube dysfunction, bilateral 03/27/2023   Numbness and tingling of both upper extremities 03/27/2023   Mild intermittent asthma without complication 03/03/2023   Encounter for screening and preventative care 03/03/2023   Essential (primary) hypertension 03/03/2023   Family history of diabetes  mellitus 03/03/2023   Class 3 severe obesity due to excess calories with body mass index (BMI) of 40.0 to 44.9 in adult 03/03/2023   Past Medical History:  Diagnosis Date   Asthma    Hypertension    Past Surgical History:  Procedure Laterality Date   APPENDECTOMY     INNER EAR SURGERY     Allergies  Allergen Reactions   Ceclor [Cefaclor] Hives         07/28/2023    3:54 PM 06/30/2023    3:34 PM 03/03/2023    2:57 PM  Depression screen PHQ 2/9  Decreased Interest 0 0 0  Down, Depressed, Hopeless 0 2 0  PHQ - 2 Score 0 2 0  Altered sleeping 0 1 0  Tired, decreased energy 2 1 0  Change in appetite 0 2 0  Feeling bad or failure about yourself  0 2 0  Trouble concentrating 0 0 0  Moving slowly or fidgety/restless 0 0 0  Suicidal thoughts 0 0 0  PHQ-9 Score 2 8 0  Difficult doing work/chores Somewhat difficult Somewhat difficult Not difficult at all       07/28/2023    3:55 PM 06/30/2023    3:35 PM 03/03/2023    2:57 PM  GAD 7 : Generalized Anxiety Score  Nervous, Anxious, on Edge 0 0 0  Control/stop worrying 0 0 0  Worry too much - different things 0 0 0  Trouble relaxing 0 0 0  Restless 0 0 0  Easily annoyed or irritable 0 1 0  Afraid - awful might happen 0 1 0  Total GAD 7 Score 0 2 0  Anxiety Difficulty Not difficult at all Somewhat difficult Not difficult at all      Review of Systems  Constitutional:  Negative for chills and fever.  Respiratory:  Negative for shortness of breath.   Cardiovascular:  Negative for chest pain.  Gastrointestinal:  Negative for abdominal pain, constipation, diarrhea, heartburn, nausea and vomiting.  Genitourinary:  Negative for dysuria, frequency and urgency.  Neurological:  Negative for dizziness and headaches.  Endo/Heme/Allergies:  Negative for polydipsia.  Psychiatric/Behavioral:  Negative for depression and suicidal ideas. The patient is not nervous/anxious.       Objective:     BP 136/70 (BP Location: Left Arm, Patient  Position: Sitting, Cuff Size: Large)   Pulse 84   Temp 98.2 F (36.8 C) (Oral)   Ht 5' 6.5" (1.689 m)   Wt 294 lb (133.4 kg)   SpO2 96%   BMI 46.74 kg/m  BP Readings from Last 3 Encounters:  07/28/23 136/70  06/30/23 130/88  03/27/23 128/88   Wt Readings from Last 3 Encounters:  07/28/23 294 lb (133.4 kg)  06/30/23 299 lb (135.6 kg)  03/27/23 285 lb (129.3 kg)      Physical Exam Vitals and nursing note reviewed.  Constitutional:      Appearance: Normal appearance.  Cardiovascular:     Rate and Rhythm: Normal rate and regular rhythm.     Pulses: Normal pulses.     Heart sounds: Normal heart sounds.  Pulmonary:     Effort: Pulmonary effort is normal.     Breath sounds: Normal breath sounds.  Neurological:     Mental Status: He is alert and oriented to person, place, and time.  Psychiatric:        Mood and Affect: Mood normal.        Behavior: Behavior normal.        Thought Content: Thought content normal.        Judgment: Judgment normal.      No results found for any visits on 07/28/23.     The ASCVD Risk score (Arnett DK, et al., 2019) failed to calculate for the following reasons:   The 2019 ASCVD risk score is only valid for ages 77 to 58    Assessment & Plan:  Depression, unspecified depression type Assessment & Plan: Uncontrolled.   He just started the zoloft  two days ago.  Discussed medication adherence.   Continue zoloft  25 mg once daily for seven days and then increase to 50 mg thereafter.   F/u in four weeks.    Class 3 severe obesity due to excess calories with serious comorbidity and body mass index (BMI) of 40.0 to 44.9 in adult Assessment & Plan: Commended patient on progress.   Long discussion about weight loss, diet, and exercise Recommended diet heavy in fruits and veggies and low in animal meats, cheeses, and dairy products, appropriate calorie intake Patient will work on decreasing saturated fats and simple carbs  Increase lean  proteins and activity  Consider health weight and wellness referral.   Vitamin D  deficiency Assessment & Plan: Repeat vitamin d  level pending.  Orders: -     VITAMIN D  25 Hydroxy (Vit-D Deficiency, Fractures)    Return in about 4 weeks (around 08/25/2023) for depression.    [name], NP

## 2023-07-28 NOTE — Assessment & Plan Note (Signed)
 Repeat vitamin d level pending.

## 2023-07-28 NOTE — Assessment & Plan Note (Signed)
 Commended patient on progress.   Long discussion about weight loss, diet, and exercise Recommended diet heavy in fruits and veggies and low in animal meats, cheeses, and dairy products, appropriate calorie intake Patient will work on decreasing saturated fats and simple carbs  Increase lean proteins and activity  Consider health weight and wellness referral.

## 2023-07-28 NOTE — Telephone Encounter (Signed)
 Patient has been notified of denials.

## 2023-07-28 NOTE — Patient Instructions (Addendum)
 Start over the counter Vitamin b12 once daily.   Stop by the lab prior to leaving today. I will notify you of your results once received.   Continue zoloft  25 for seven days and then increase to 50 mg once daily.   F/u in four weeks.   It was a pleasure to see you today!

## 2023-07-28 NOTE — Assessment & Plan Note (Signed)
 Uncontrolled.   He just started the zoloft  two days ago.  Discussed medication adherence.   Continue zoloft  25 mg once daily for seven days and then increase to 50 mg thereafter.   F/u in four weeks.

## 2023-07-31 ENCOUNTER — Encounter: Payer: Self-pay | Admitting: General Practice

## 2023-07-31 ENCOUNTER — Other Ambulatory Visit: Payer: Self-pay | Admitting: General Practice

## 2023-07-31 DIAGNOSIS — E559 Vitamin D deficiency, unspecified: Secondary | ICD-10-CM

## 2023-07-31 MED ORDER — VITAMIN D (ERGOCALCIFEROL) 1.25 MG (50000 UNIT) PO CAPS: 50000.0000 [IU] | ORAL_CAPSULE | ORAL | 2 refills | Status: AC

## 2023-08-07 DIAGNOSIS — Z419 Encounter for procedure for purposes other than remedying health state, unspecified: Secondary | ICD-10-CM | POA: Diagnosis not present

## 2023-08-10 ENCOUNTER — Encounter: Payer: Self-pay | Admitting: General Practice

## 2023-08-22 ENCOUNTER — Other Ambulatory Visit: Payer: Self-pay | Admitting: General Practice

## 2023-08-22 DIAGNOSIS — F32A Depression, unspecified: Secondary | ICD-10-CM

## 2023-08-28 ENCOUNTER — Telehealth: Payer: Self-pay

## 2023-08-28 ENCOUNTER — Telehealth (INDEPENDENT_AMBULATORY_CARE_PROVIDER_SITE_OTHER): Admitting: General Practice

## 2023-08-28 ENCOUNTER — Encounter: Payer: Self-pay | Admitting: General Practice

## 2023-08-28 VITALS — Ht 66.5 in | Wt 284.0 lb

## 2023-08-28 DIAGNOSIS — R200 Anesthesia of skin: Secondary | ICD-10-CM

## 2023-08-28 DIAGNOSIS — I10 Essential (primary) hypertension: Secondary | ICD-10-CM | POA: Diagnosis not present

## 2023-08-28 DIAGNOSIS — F32A Depression, unspecified: Secondary | ICD-10-CM

## 2023-08-28 DIAGNOSIS — E66813 Obesity, class 3: Secondary | ICD-10-CM | POA: Diagnosis not present

## 2023-08-28 DIAGNOSIS — Z6841 Body Mass Index (BMI) 40.0 and over, adult: Secondary | ICD-10-CM

## 2023-08-28 DIAGNOSIS — R202 Paresthesia of skin: Secondary | ICD-10-CM

## 2023-08-28 MED ORDER — SERTRALINE HCL 100 MG PO TABS: 100.0000 mg | ORAL_TABLET | Freq: Every day | ORAL | 5 refills | Status: DC

## 2023-08-28 NOTE — Assessment & Plan Note (Signed)
 Controlled.  Continue Zoloft  100 mg daily at bedtime.  Rx sent.  F/u in 6 months.

## 2023-08-28 NOTE — Telephone Encounter (Signed)
 Patient needs lab appt scheduled for 3 months and 6 month OV appt scheduled with [name], NP. Called patient and LMTCB to schedule lab and ov appts.

## 2023-08-28 NOTE — Assessment & Plan Note (Signed)
 Discussed checking BP at home.  Has lost 15 lbs with exercise. Commended on progress.   F/u in office in 6 months.

## 2023-08-28 NOTE — Assessment & Plan Note (Signed)
 Has lost 15 lbs with exercise. Commended on progress.

## 2023-08-28 NOTE — Progress Notes (Signed)
 Virtual Visit via Video Note  I connected with Julian Franklin on 08/28/23 at  3:40 PM EDT by a video enabled telemedicine application and verified that I am speaking with the correct person using two identifiers.  Patient Location: Other:  sitting in his car in a parking lot Provider Location: Office/Clinic  I discussed the limitations, risks, security, and privacy concerns of performing an evaluation and management service by video and the availability of in person appointments. I also discussed with the patient that there may be a patient responsible charge related to this service. The patient expressed understanding and agreed to proceed.  Subjective: PCP: Julian Nation, NP  Chief Complaint  Patient presents with   Depression    Patient following up on depression. Currently taking zoloft  100 mg tabs every day. Patient is doing well on 100 mg dose.    Hypertension   Depression       Hypertension    Julian Franklin is a 38 year old male with past medical history of HTN, obesity, GERD, OSA, depression, prediabetes, presents today via televisit for a follow up.   Depression: currently managed on Zoloft  100 mg once daily at bedtime. He reports that he has been doing well. Reports less mind racing thoughts, doesn't feel on the edge anymore. Overall, improved.   HTN: Currently not on any medication. He has been doing the burn boot camp. He has lost about 15 lbs since his last visit. Has not been checking his BP at home.   Numbness and tingling: intermittent. Comes and goes. Worse in right hand. Taking his vitamin d  as prescribed. No new symptoms. Similar to a few months ago; it had improved but now is back again. No new areas or other symptoms.   BP Readings from Last 3 Encounters:  07/28/23 136/70  06/30/23 130/88  03/27/23 128/88     ROS: Per HPI  Current Outpatient Medications:    acetaminophen  (TYLENOL ) 500 MG tablet, Take 500 mg by mouth every 6 (six) hours as  needed for headache., Disp: , Rfl:    albuterol  (PROVENTIL ) (2.5 MG/3ML) 0.083% nebulizer solution, Take 3 mLs (2.5 mg total) by nebulization every 4 (four) hours as needed for wheezing or shortness of breath., Disp: 75 mL, Rfl: 0   famotidine  (PEPCID ) 20 MG tablet, Take 1 tablet (20 mg total) by mouth daily., Disp: 30 tablet, Rfl: 1   Vitamin D , Ergocalciferol , (DRISDOL ) 1.25 MG (50000 UNIT) CAPS capsule, Take 1 capsule (50,000 Units total) by mouth every 7 (seven) days. For 12 weeks., Disp: 4 capsule, Rfl: 2   sertraline  (ZOLOFT ) 100 MG tablet, Take 1 tablet (100 mg total) by mouth at bedtime., Disp: 30 tablet, Rfl: 5  Observations/Objective: Today's Vitals   08/28/23 1529  Weight: 284 lb (128.8 kg)  Height: 5' 6.5" (1.689 m)   Physical Exam Nursing note reviewed.  Constitutional:      Appearance: Normal appearance.  Eyes:     Conjunctiva/sclera: Conjunctivae normal.  Pulmonary:     Effort: Pulmonary effort is normal.  Neurological:     Mental Status: He is alert and oriented to person, place, and time.  Psychiatric:        Mood and Affect: Mood normal.        Behavior: Behavior normal.        Thought Content: Thought content normal.        Judgment: Judgment normal.     Assessment and Plan: Essential (primary) hypertension Assessment & Plan: Discussed checking  BP at home.  Has lost 15 lbs with exercise. Commended on progress.   F/u in office in 6 months.   Depression, unspecified depression type Assessment & Plan: Controlled.  Continue Zoloft  100 mg daily at bedtime.  Rx sent.  F/u in 6 months.  Orders: -     Sertraline  HCl; Take 1 tablet (100 mg total) by mouth at bedtime.  Dispense: 30 tablet; Refill: 5  Class 3 severe obesity due to excess calories with body mass index (BMI) of 40.0 to 44.9 in adult Assessment & Plan: Has lost 15 lbs with exercise. Commended on progress.    Numbness and tingling of both upper extremities Assessment & Plan: Suspect  symptoms could be secondary to vitamin deficiency as they were improving but returned.   Vitamin d  level was 21 on 07/31/23.  Restarted Vitamin D  50000 units weekly.  Will recheck labs in 3 months.  Discussed starting vitamin b 12 1000 mcg once daily - over the counter. Lab work was normal but on the low end.  Fu/ in office in 6 months. Consider neurology referral.    Follow Up Instructions: Return in about 6 months (around 02/27/2024) for HTN and depression.   I discussed the assessment and treatment plan with the patient. The patient was provided an opportunity to ask questions, and all were answered. The patient agreed with the plan and demonstrated an understanding of the instructions.   The patient was advised to call back or seek an in-person evaluation if the symptoms worsen or if the condition fails to improve as anticipated.  The above assessment and management plan was discussed with the patient. The patient verbalized understanding of and has agreed to the management plan.   Julian Nation, NP

## 2023-08-28 NOTE — Patient Instructions (Signed)
 Continue zoloft  100 mg at bedtime.  Refill sent.   Start vitamin b 12 1000 mcg once daily. Ok with gummy or tablet.  Continue vitamin d  once weekly. Labs in three months.  Follow up in office 6 months.   It was a pleasure to see you today!

## 2023-08-28 NOTE — Assessment & Plan Note (Signed)
 Suspect symptoms could be secondary to vitamin deficiency as they were improving but returned.   Vitamin d  level was 21 on 07/31/23.  Restarted Vitamin D  50000 units weekly.  Will recheck labs in 3 months.  Discussed starting vitamin b 12 1000 mcg once daily - over the counter. Lab work was normal but on the low end.  Fu/ in office in 6 months. Consider neurology referral.

## 2023-08-29 NOTE — Telephone Encounter (Signed)
 Please contact patient to schedule below appts.

## 2023-08-30 NOTE — Telephone Encounter (Signed)
 Lvmtcb, sent mychart message

## 2023-09-07 DIAGNOSIS — Z419 Encounter for procedure for purposes other than remedying health state, unspecified: Secondary | ICD-10-CM | POA: Diagnosis not present

## 2023-09-25 ENCOUNTER — Ambulatory Visit: Payer: BC Managed Care – PPO | Admitting: General Practice

## 2023-10-07 DIAGNOSIS — Z419 Encounter for procedure for purposes other than remedying health state, unspecified: Secondary | ICD-10-CM | POA: Diagnosis not present

## 2023-10-12 ENCOUNTER — Other Ambulatory Visit: Payer: Self-pay | Admitting: General Practice

## 2023-10-12 DIAGNOSIS — F32A Depression, unspecified: Secondary | ICD-10-CM

## 2023-10-12 DIAGNOSIS — K219 Gastro-esophageal reflux disease without esophagitis: Secondary | ICD-10-CM

## 2023-11-07 DIAGNOSIS — Z419 Encounter for procedure for purposes other than remedying health state, unspecified: Secondary | ICD-10-CM | POA: Diagnosis not present

## 2023-11-17 ENCOUNTER — Encounter: Payer: Self-pay | Admitting: General Practice

## 2023-11-28 ENCOUNTER — Encounter: Payer: Self-pay | Admitting: General Practice

## 2023-12-08 DIAGNOSIS — Z419 Encounter for procedure for purposes other than remedying health state, unspecified: Secondary | ICD-10-CM | POA: Diagnosis not present

## 2023-12-10 ENCOUNTER — Other Ambulatory Visit: Payer: Self-pay | Admitting: General Practice

## 2023-12-12 ENCOUNTER — Encounter: Payer: Self-pay | Admitting: General Practice

## 2023-12-13 MED ORDER — ZEPBOUND 2.5 MG/0.5ML ~~LOC~~ SOAJ: 2.5000 mg | SUBCUTANEOUS | 0 refills | Status: DC

## 2024-01-05 ENCOUNTER — Encounter: Payer: Self-pay | Admitting: General Practice

## 2024-01-05 ENCOUNTER — Ambulatory Visit (INDEPENDENT_AMBULATORY_CARE_PROVIDER_SITE_OTHER): Admitting: General Practice

## 2024-01-05 VITALS — BP 124/80 | HR 74 | Temp 98.3°F | Ht 66.5 in | Wt 259.0 lb

## 2024-01-05 DIAGNOSIS — K219 Gastro-esophageal reflux disease without esophagitis: Secondary | ICD-10-CM

## 2024-01-05 DIAGNOSIS — F32A Depression, unspecified: Secondary | ICD-10-CM | POA: Diagnosis not present

## 2024-01-05 DIAGNOSIS — E559 Vitamin D deficiency, unspecified: Secondary | ICD-10-CM

## 2024-01-05 DIAGNOSIS — E66813 Obesity, class 3: Secondary | ICD-10-CM

## 2024-01-05 DIAGNOSIS — I10 Essential (primary) hypertension: Secondary | ICD-10-CM

## 2024-01-05 DIAGNOSIS — Z6841 Body Mass Index (BMI) 40.0 and over, adult: Secondary | ICD-10-CM

## 2024-01-05 MED ORDER — TIRZEPATIDE-WEIGHT MANAGEMENT 5 MG/0.5ML ~~LOC~~ SOAJ: 5.0000 mg | SUBCUTANEOUS | 2 refills | Status: DC

## 2024-01-05 MED ORDER — FAMOTIDINE 20 MG PO TABS: 20.0000 mg | ORAL_TABLET | Freq: Every day | ORAL | 1 refills | Status: DC

## 2024-01-05 MED ORDER — SERTRALINE HCL 100 MG PO TABS: 100.0000 mg | ORAL_TABLET | Freq: Every day | ORAL | 1 refills | Status: AC

## 2024-01-05 NOTE — Addendum Note (Signed)
 Addended by: LORELLE ROCKY BRAVO on: 01/05/2024 04:03 PM   Modules accepted: Orders

## 2024-01-05 NOTE — Progress Notes (Signed)
 Established Patient Office Visit  Subjective   Patient ID: Julian Franklin, male    DOB: 01-19-1986  Age: 38 y.o. MRN: [id number]  Chief Complaint  Patient presents with   Obesity    Wants to discuss moving dose up; has dropped 40 lbs so far. Patient is tolerating well.     HPI  Julian Franklin is a 38 year old male with past medical history of HTN, obesity, asthma, GERD, OSA, prediabetes, HLD presents today for a follow up.   Discussed the use of AI scribe software for clinical note transcription with the patient, who gave verbal consent to proceed.  History of Present Illness Julian Franklin is a 38 year old male who presents for a follow-up regarding weight management.  He has been on Zepbound  2.5 mg once a week since August and notes that his appetite is starting to return. No nausea or vomiting. He has lost 40 pounds since starting the medication. He reports significant improvement in fatigue and breathing, noting that he no longer experiences wheezing and feels better overall. He is able to run without stopping during workouts at [facility], which he attends five days a week, sometimes six.  He and his wife meal prep every Sunday for the week, although he admits to not always making healthy choices. He drinks protein shakes with 50 grams of protein. He acknowledges the importance of water intake but has not yet increased his consumption.  He accidentally took vitamin D supplements daily instead of weekly but has since corrected this mistake. He completed the course of vitamin D 50,000 IU capsules that were prescribed in May.  He is currently taking Zoloft 100 mg, which is working well without any issues. He needs a refill.   He is currently taking famotidine 20 mg for heartburn and needs a refill.     Patient Active Problem List   Diagnosis Date Noted   Vitamin D deficiency 07/28/2023   Panic attack 06/30/2023   Gastroesophageal reflux disease  06/30/2023   Prediabetes 06/30/2023   Obstructive sleep apnea 06/30/2023   Hypertriglyceridemia 06/30/2023   Depression 06/30/2023   Eustachian tube dysfunction, bilateral 03/27/2023   Numbness and tingling of both upper extremities 03/27/2023   Mild intermittent asthma without complication 03/03/2023   Encounter for screening and preventative care 03/03/2023   Essential (primary) hypertension 03/03/2023   Family history of diabetes mellitus 03/03/2023   Class 3 severe obesity Franklin to excess calories with body mass index (BMI) of 40.0 to 44.9 in adult (HCC) 03/03/2023   Past Medical History:  Diagnosis Date   Asthma    Hypertension    Past Surgical History:  Procedure Laterality Date   APPENDECTOMY     INNER EAR SURGERY     Allergies  Allergen Reactions   Ceclor [Cefaclor] Hives         10 /12/2023    3:48 PM 07/28/2023    3:54 PM 06/30/2023    3:34 PM  Depression screen PHQ 2/9  Decreased Interest 0 0 0  Down, Depressed, Hopeless 0 0 2  PHQ - 2 Score 0 0 2  Altered sleeping 0 0 1  Tired, decreased energy 0 2 1  Change in appetite 0 0 2  Feeling bad or failure about yourself  0 0 2  Trouble concentrating 0 0 0  Moving slowly or fidgety/restless 0 0 0  Suicidal thoughts 0 0 0  PHQ-9 Score 0 2 8  Difficult doing work/chores Not  difficult at all Somewhat difficult Somewhat difficult       01/05/2024    3:48 PM 07/28/2023    3:55 PM 06/30/2023    3:35 PM 03/03/2023    2:57 PM  GAD 7 : Generalized Anxiety Score  Nervous, Anxious, on Edge 0 0 0 0  Control/stop worrying 0 0 0 0  Worry too much - different things 0 0 0 0  Trouble relaxing 0 0 0 0  Restless 0 0 0 0  Easily annoyed or irritable 0 0 1 0  Afraid - awful might happen 0 0 1 0  Total GAD 7 Score 0 0 2 0  Anxiety Difficulty Not difficult at all Not difficult at all Somewhat difficult Not difficult at all      Review of Systems  Constitutional:  Negative for chills and fever.  Respiratory:  Negative for  shortness of breath.   Cardiovascular:  Negative for chest pain.  Gastrointestinal:  Negative for abdominal pain, constipation, diarrhea, heartburn, nausea and vomiting.  Genitourinary:  Negative for dysuria, frequency and urgency.  Neurological:  Negative for dizziness and headaches.  Endo/Heme/Allergies:  Negative for polydipsia.  Psychiatric/Behavioral:  Negative for depression and suicidal ideas. The patient is not nervous/anxious.       Objective:     BP 124/80   Pulse 74   Temp 98.3 F (36.8 C) (Temporal)   Ht 5' 6.5 (1.689 m)   Wt 259 lb (117.5 kg)   SpO2 95%   BMI 41.18 kg/m  BP Readings from Last 3 Encounters:  01/05/24 124/80  07/28/23 136/70  06/30/23 130/88   Wt Readings from Last 3 Encounters:  01/05/24 259 lb (117.5 kg)  08/28/23 284 lb (128.8 kg)  07/28/23 294 lb (133.4 kg)      Physical Exam Vitals and nursing note reviewed.  Constitutional:      Appearance: Normal appearance.  Cardiovascular:     Rate and Rhythm: Normal rate and regular rhythm.     Pulses: Normal pulses.     Heart sounds: Normal heart sounds.  Pulmonary:     Effort: Pulmonary effort is normal.     Breath sounds: Normal breath sounds.  Neurological:     Mental Status: He is alert and oriented to person, place, and time.  Psychiatric:        Mood and Affect: Mood normal.        Behavior: Behavior normal.        Thought Content: Thought content normal.        Judgment: Judgment normal.      No results found for any visits on 01/05/24.     The 10-year ASCVD risk score (Arnett DK, et al., 2019) is: 0.9%    Assessment & Plan:  Class 3 severe obesity Franklin to excess calories with serious comorbidity and body mass index (BMI) of 40.0 to 44.9 in adult (HCC) -     Tirzepatide -Weight Management; Inject 5 mg into the skin once a week.  Dispense: 2 mL; Refill: 2  Depression, unspecified depression type -     Sertraline  HCl; Take 1 tablet (100 mg total) by mouth daily.  Dispense:  90 tablet; Refill: 1  Gastroesophageal reflux disease, unspecified whether esophagitis present -     Famotidine ; Take 1 tablet (20 mg total) by mouth daily.  Dispense: 90 tablet; Refill: 1  Vitamin D  deficiency -     VITAMIN D  25 Hydroxy (Vit-D Deficiency, Fractures)  Essential (primary) hypertension    Assessment  and Plan Assessment & Plan Class 3 severe obesity (BMI 40.0-44.9) He lost 40 pounds on Zepbound  2.5 mg weekly. Appetite returned without nausea or vomiting. Engaged in lifestyle modifications. Discussed Medicaid coverage implications if BMI decreases. - Increase Zepbound  to 5 mg weekly. Rx sent. - Encourage continued meal prepping and regular exercise. - Discuss the importance of making healthier food choices to avoid regaining weight.  Essential hypertension Blood pressure well-controlled, likely Franklin to weight loss. -Continue off meds.  Depression Stable on Sertraline  100 mg at bedtime. - Refill Sertraline  100 mg oral at bedtime.  Gastroesophageal reflux disease (GERD) No issues on Famotidine . - Continue Famotidine  20 mg oral daily.   Return in about 8 weeks (around 03/03/2024) for physical and fasting labs.SABRA Carrol Aurora, NP

## 2024-01-05 NOTE — Patient Instructions (Addendum)
 Stop by the lab prior to leaving today. I will notify you of your results once received.   Increase zepbound  to 5 mg once weekly. I sent in the prescription. Let me know if you develop any side effects.   Refill sent for sertraline  and pepcid .   Schedule physical for 03/02/24.  It was a pleasure to see you today!

## 2024-01-06 LAB — VITAMIN D 25 HYDROXY (VIT D DEFICIENCY, FRACTURES): Vit D, 25-Hydroxy: 61 ng/mL (ref 30–100)

## 2024-01-07 DIAGNOSIS — Z419 Encounter for procedure for purposes other than remedying health state, unspecified: Secondary | ICD-10-CM | POA: Diagnosis not present

## 2024-01-08 ENCOUNTER — Ambulatory Visit: Payer: Self-pay | Admitting: General Practice

## 2024-01-15 ENCOUNTER — Other Ambulatory Visit (HOSPITAL_COMMUNITY): Payer: Self-pay

## 2024-01-15 ENCOUNTER — Telehealth: Payer: Self-pay

## 2024-01-15 NOTE — Telephone Encounter (Signed)
 Pharmacy Patient Advocate Encounter   Received notification from Onbase that prior authorization for Wegovy  0.25 is required/requested.   Insurance verification completed.   The patient is insured through [facility].   Per test claim: PA required; PA submitted to above mentioned insurance via Latent Key/confirmation #/EOC [id number] Status is pending    No evidence of sleep study in record to support OSA indication.

## 2024-01-16 ENCOUNTER — Other Ambulatory Visit (HOSPITAL_COMMUNITY): Payer: Self-pay

## 2024-01-16 NOTE — Telephone Encounter (Signed)
 Pharmacy Patient Advocate Encounter  Received notification from [employer] that Prior Authorization for Wegovy  0.25 has been APPROVED from 11/16/23 to 01/14/25. Ran test claim, Copay is $24.99. This test claim was processed through [facility]- copay amounts may vary at other pharmacies due to pharmacy/plan contracts, or as the patient moves through the different stages of their insurance plan.   PA #/Case ID/Reference #: # [id number]

## 2024-01-23 ENCOUNTER — Other Ambulatory Visit (HOSPITAL_COMMUNITY): Payer: Self-pay

## 2024-01-24 ENCOUNTER — Telehealth: Payer: Self-pay

## 2024-01-24 ENCOUNTER — Other Ambulatory Visit (HOSPITAL_COMMUNITY): Payer: Self-pay

## 2024-01-24 DIAGNOSIS — E781 Pure hyperglyceridemia: Secondary | ICD-10-CM

## 2024-01-24 DIAGNOSIS — R7303 Prediabetes: Secondary | ICD-10-CM

## 2024-01-24 NOTE — Telephone Encounter (Signed)
.  Prior Authorization form/request asks a question that requires your assistance. Please see the question below and advise accordingly. The PA will not be submitted until the necessary information is received.   The last A1C was December 2024

## 2024-01-24 NOTE — Telephone Encounter (Signed)
 LMTCB

## 2024-01-24 NOTE — Telephone Encounter (Signed)
 A1C has not been checked since 02/2023 and PA will not be done until more recent labs are done. Patients next appt is not until 03/01/24

## 2024-01-29 NOTE — Telephone Encounter (Signed)
 LMTCB

## 2024-02-01 NOTE — Telephone Encounter (Signed)
 Appt scheduled for tomorrow for fasting labs.

## 2024-02-02 ENCOUNTER — Other Ambulatory Visit (INDEPENDENT_AMBULATORY_CARE_PROVIDER_SITE_OTHER)

## 2024-02-02 DIAGNOSIS — R7303 Prediabetes: Secondary | ICD-10-CM

## 2024-02-02 DIAGNOSIS — E781 Pure hyperglyceridemia: Secondary | ICD-10-CM | POA: Diagnosis not present

## 2024-02-02 NOTE — Addendum Note (Signed)
 Addended by: HOPE VEVA PARAS on: 02/02/2024 04:02 PM   Modules accepted: Orders

## 2024-02-03 LAB — COMPREHENSIVE METABOLIC PANEL WITH GFR
AG Ratio: 1.6 (calc) (ref 1.0–2.5)
ALT: 17 U/L (ref 9–46)
AST: 14 U/L (ref 10–40)
Albumin: 4.7 g/dL (ref 3.6–5.1)
Alkaline phosphatase (APISO): 77 U/L (ref 36–130)
BUN: 20 mg/dL (ref 7–25)
CO2: 28 mmol/L (ref 20–32)
Calcium: 9.5 mg/dL (ref 8.6–10.3)
Chloride: 102 mmol/L (ref 98–110)
Creat: 0.95 mg/dL (ref 0.60–1.29)
Globulin: 3 g/dL (ref 1.9–3.7)
Glucose, Bld: 81 mg/dL (ref 65–99)
Potassium: 4.6 mmol/L (ref 3.5–5.3)
Sodium: 138 mmol/L (ref 135–146)
Total Bilirubin: 0.5 mg/dL (ref 0.2–1.2)
Total Protein: 7.7 g/dL (ref 6.1–8.1)
eGFR: 104 mL/min/1.73m2 (ref >=60)

## 2024-02-03 LAB — HEMOGLOBIN A1C
Hgb A1c MFr Bld: 5.6 % (ref <5.7)
Mean Plasma Glucose: 114 mg/dL
eAG (mmol/L): 6.3 mmol/L

## 2024-02-03 LAB — LIPID PANEL
Cholesterol: 143 mg/dL (ref <200)
HDL: 35 mg/dL — ABNORMAL LOW (ref >=40)
LDL Cholesterol (Calc): 76 mg/dL
Non-HDL Cholesterol (Calc): 108 mg/dL (ref <130)
Total CHOL/HDL Ratio: 4.1 (calc) (ref <5.0)
Triglycerides: 227 mg/dL — ABNORMAL HIGH (ref <150)

## 2024-02-04 ENCOUNTER — Ambulatory Visit: Payer: Self-pay | Admitting: General Practice

## 2024-02-07 DIAGNOSIS — Z419 Encounter for procedure for purposes other than remedying health state, unspecified: Secondary | ICD-10-CM | POA: Diagnosis not present

## 2024-02-08 ENCOUNTER — Telehealth: Payer: Self-pay

## 2024-02-08 ENCOUNTER — Other Ambulatory Visit (HOSPITAL_COMMUNITY): Payer: Self-pay

## 2024-02-08 NOTE — Telephone Encounter (Signed)
 Pharmacy Patient Advocate Encounter  Received notification from [employer] that Prior Authorization for Zepbound  5 has been APPROVED from 02/08/24 to 02/06/25. Ran test claim, Copay is $24.99. This test claim was processed through [facility]- copay amounts may vary at other pharmacies due to pharmacy/plan contracts, or as the patient moves through the different stages of their insurance plan.   PA #/Case ID/Reference #: # [id number]

## 2024-02-08 NOTE — Telephone Encounter (Signed)
 Pharmacy Patient Advocate Encounter   Received notification from Onbase that prior authorization for Zepbound  5 is required/requested.   Insurance verification completed.   The patient is insured through [facility].   Per test claim: PA required; PA submitted to above mentioned insurance via Latent Key/confirmation #/EOC [id number] Status is pending

## 2024-02-08 NOTE — Telephone Encounter (Signed)
My chart message sent to patient about approval.

## 2024-03-01 ENCOUNTER — Encounter: Admitting: General Practice

## 2024-03-06 ENCOUNTER — Encounter: Payer: Self-pay | Admitting: General Practice

## 2024-03-28 ENCOUNTER — Encounter: Payer: Self-pay | Admitting: Emergency Medicine

## 2024-03-28 ENCOUNTER — Emergency Department
Admission: EM | Admit: 2024-03-28 | Discharge: 2024-03-28 | Disposition: A | Discharge status: Home / Self Care | Source: Home / Self Care

## 2024-03-28 ENCOUNTER — Emergency Department

## 2024-03-28 ENCOUNTER — Other Ambulatory Visit: Payer: Self-pay

## 2024-03-28 DIAGNOSIS — J45909 Unspecified asthma, uncomplicated: Secondary | ICD-10-CM | POA: Diagnosis not present

## 2024-03-28 DIAGNOSIS — S61412A Laceration without foreign body of left hand, initial encounter: Secondary | ICD-10-CM | POA: Insufficient documentation

## 2024-03-28 DIAGNOSIS — Z23 Encounter for immunization: Secondary | ICD-10-CM | POA: Insufficient documentation

## 2024-03-28 DIAGNOSIS — S6992XA Unspecified injury of left wrist, hand and finger(s), initial encounter: Secondary | ICD-10-CM | POA: Diagnosis present

## 2024-03-28 DIAGNOSIS — W231XXA Caught, crushed, jammed, or pinched between stationary objects, initial encounter: Secondary | ICD-10-CM | POA: Diagnosis not present

## 2024-03-28 DIAGNOSIS — S60222A Contusion of left hand, initial encounter: Secondary | ICD-10-CM

## 2024-03-28 DIAGNOSIS — I10 Essential (primary) hypertension: Secondary | ICD-10-CM | POA: Diagnosis not present

## 2024-03-28 MED ORDER — TETANUS-DIPHTH-ACELL PERTUSSIS 5-2-15.5 LF-MCG/0.5 IM SUSP
0.5000 mL | Freq: Once | INTRAMUSCULAR | Status: AC
  Administered 2024-03-28: 0.5 mL via INTRAMUSCULAR
  Filled 2024-03-28: qty 0.5

## 2024-03-28 MED ORDER — LIDOCAINE HCL (PF) 1 % IJ SOLN
5.0000 mL | Freq: Once | INTRAMUSCULAR | Status: AC
  Administered 2024-03-28: 5 mL via INTRADERMAL
  Filled 2024-03-28: qty 5

## 2024-03-28 NOTE — ED Provider Notes (Signed)
 "  [facility] Provider Note    Event Date/Time   First MD Initiated Contact with Patient 03/28/24 1425     (approximate)   History   Hand Injury   HPI  Julian Franklin is a 39 y.o. male history of hypertension, asthma, prediabetes presents emergency department with laceration and injury to the left hand.  Patient states his hand got caught between a piece of wood and a sledgehammer earlier today.  States unsure of his last tetanus shot.  Denies numbness or tingling.      Physical Exam   Triage Vital Signs: ED Triage Vitals [03/28/24 1409]  Encounter Vitals Group     BP (!) 151/100     Girls Systolic BP Percentile      Girls Diastolic BP Percentile      Boys Systolic BP Percentile      Boys Diastolic BP Percentile      Pulse Rate 87     Resp 16     Temp 98.7 F (37.1 C)     Temp Source Oral     SpO2 95 %     Weight 257 lb 15 oz (117 kg)     Height 5' 6.5 (1.689 m)     Head Circumference      Peak Flow      Pain Score 6     Pain Loc      Pain Education      Exclude from Growth Chart     Most recent vital signs: Vitals:   03/28/24 1409 03/28/24 1447  BP: (!) 151/100   Pulse: 87   Resp: 16   Temp: 98.7 F (37.1 C)   SpO2: 95% 95%     General: Awake, no distress.   CV:  Good peripheral perfusion.  Resp:  Normal effort.  Abd:  No distention.   Other:  Left hand tender, laceration noted in the webspace of the thumb and index finger, no foreign body noted on exam.  Full range of motion, neurovascular intact   ED Results / Procedures / Treatments   Labs (all labs ordered are listed, but only abnormal results are displayed) Labs Reviewed - No data to display   EKG     RADIOLOGY X-ray left hand    PROCEDURES:   .Laceration Repair  Date/Time: 03/28/2024 4:45 PM  Performed by: Gasper Devere ORN, PA-C Authorized by: Gasper Devere ORN, PA-C   Consent:    Consent obtained:  Verbal   Consent given by:  Patient    Risks, benefits, and alternatives were discussed: yes     Risks discussed:  Infection, pain, retained foreign body, tendon damage, poor cosmetic result, need for additional repair, nerve damage, poor wound healing and vascular damage   Alternatives discussed:  No treatment Universal protocol:    Procedure explained and questions answered to patient or proxy's satisfaction: yes     Immediately prior to procedure, a time out was called: yes     Patient identity confirmed:  Verbally with patient Anesthesia:    Anesthesia method:  Local infiltration   Local anesthetic:  Lidocaine 1% w/o epi Laceration details:    Location:  Hand   Hand location:  L hand, dorsum   Length (cm):  1 Pre-procedure details:    Preparation:  Patient was prepped and draped in usual sterile fashion and imaging obtained to evaluate for foreign bodies Exploration:    Limited defect created (wound extended): no     Hemostasis  achieved with:  Direct pressure   Imaging obtained: x-ray     Imaging outcome: foreign body not noted     Wound exploration: wound explored through full range of motion and entire depth of wound visualized     Wound extent: areolar tissue not violated, fascia not violated, no foreign body, no signs of injury, no nerve damage, no tendon damage, no underlying fracture and no vascular damage     Contaminated: no   Treatment:    Area cleansed with:  Povidone-iodine   Amount of cleaning:  Standard   Irrigation solution:  Sterile saline   Irrigation method:  Tap   Visualized foreign bodies/material removed: no     Debridement:  None   Undermining:  None   Scar revision: no   Skin repair:    Repair method:  Sutures   Suture size:  5-0   Suture material:  Nylon   Suture technique:  Simple interrupted   Number of sutures:  1 Approximation:    Approximation:  Close Repair type:    Repair type:  Simple Post-procedure details:    Dressing:  Non-adherent dressing   Procedure completion:   Tolerated well, no immediate complications   Critical Care:  no Chief Complaint  Patient presents with   Hand Injury      MEDICATIONS ORDERED IN ED: Medications  Tdap (ADACEL) injection 0.5 mL (0.5 mLs Intramuscular Given 03/28/24 1521)  lidocaine (PF) (XYLOCAINE) 1 % injection 5 mL (5 mLs Intradermal Given 03/28/24 1522)     IMPRESSION / MDM / ASSESSMENT AND PLAN / ED COURSE  I reviewed the triage vital signs and the nursing notes.                              Differential diagnosis includes, but is not limited to, laceration, contusion, fracture, foreign body  Patient's presentation is most consistent with acute illness / injury with system symptoms.   Medications given: Tdap, Xylocaine for local  X-ray left hand independent review interpretation by me as being negative for any acute abnormality   See procedure note for laceration repair.  The area had mostly closed on its own and there was no wide open laceration.  There were 1 area did opening With pressure applied.  Therefore did place 1 stitch in the patient.  He is to follow-up with his regular doctor in 1 week for suture removal.  Return if any sign of infection.  Tdap was updated here in the ED.  Discharged stable condition.  Patient is in agreement treatment plan   FINAL CLINICAL IMPRESSION(S) / ED DIAGNOSES   Final diagnoses:  Laceration of left hand without foreign body, initial encounter  Contusion of left hand, initial encounter     Rx / DC Orders   ED Discharge Orders     None        Note:  This document was prepared using Dragon voice recognition software and may include unintentional dictation errors.    Gasper Devere ORN, PA-C 03/28/24 1647  "

## 2024-03-28 NOTE — ED Triage Notes (Signed)
 Pt reports left hand injury, pt got hand wedged between wood and sledgehammer. Hand wrapped and bleeding controlled at this time.

## 2024-03-28 NOTE — Discharge Instructions (Signed)
 Keep the areas clean as possible. Wash the area with soap and water daily Remove the stitches in 1 week Return if any sign of infection

## 2024-03-29 ENCOUNTER — Encounter: Payer: Self-pay | Admitting: General Practice

## 2024-04-01 ENCOUNTER — Ambulatory Visit: Admitting: General Practice

## 2024-04-01 ENCOUNTER — Encounter: Payer: Self-pay | Admitting: General Practice

## 2024-04-01 VITALS — BP 122/80 | HR 90 | Temp 97.9°F | Ht 66.5 in | Wt 259.6 lb

## 2024-04-01 DIAGNOSIS — I10 Essential (primary) hypertension: Secondary | ICD-10-CM | POA: Diagnosis not present

## 2024-04-01 DIAGNOSIS — F330 Major depressive disorder, recurrent, mild: Secondary | ICD-10-CM

## 2024-04-01 DIAGNOSIS — Z6841 Body Mass Index (BMI) 40.0 and over, adult: Secondary | ICD-10-CM | POA: Diagnosis not present

## 2024-04-01 DIAGNOSIS — K219 Gastro-esophageal reflux disease without esophagitis: Secondary | ICD-10-CM

## 2024-04-01 DIAGNOSIS — E781 Pure hyperglyceridemia: Secondary | ICD-10-CM | POA: Diagnosis not present

## 2024-04-01 DIAGNOSIS — S61412A Laceration without foreign body of left hand, initial encounter: Secondary | ICD-10-CM | POA: Insufficient documentation

## 2024-04-01 DIAGNOSIS — E66813 Obesity, class 3: Secondary | ICD-10-CM | POA: Diagnosis not present

## 2024-04-01 DIAGNOSIS — Z0000 Encounter for general adult medical examination without abnormal findings: Secondary | ICD-10-CM

## 2024-04-01 DIAGNOSIS — S61412D Laceration without foreign body of left hand, subsequent encounter: Secondary | ICD-10-CM | POA: Diagnosis not present

## 2024-04-01 MED ORDER — ZEPBOUND 7.5 MG/0.5ML ~~LOC~~ SOAJ: 7.5000 mg | SUBCUTANEOUS | 1 refills | Status: AC

## 2024-04-01 MED ORDER — FAMOTIDINE 20 MG PO TABS: 20.0000 mg | ORAL_TABLET | Freq: Two times a day (BID) | ORAL | 1 refills | Status: AC

## 2024-04-01 NOTE — Assessment & Plan Note (Signed)
 Controlled.   Continue Zoloft  100 mg once daily at bedtime.

## 2024-04-01 NOTE — Assessment & Plan Note (Signed)
 Slightly worse.  Increase famotidine  20 mg twice daily.   Rx sent.

## 2024-04-01 NOTE — Assessment & Plan Note (Signed)
 Commended patient on progress.  BP at goal.

## 2024-04-01 NOTE — Assessment & Plan Note (Signed)
 Immunizations tdap UTD; declines influenza and pneumonia vaccine.  Discussed the importance of a healthy diet and regular exercise in order for weight loss, and to reduce the risk of further co-morbidity.  Exam stable. Labs pending.  Follow up in 1 year for repeat physical.

## 2024-04-01 NOTE — Assessment & Plan Note (Signed)
 Commended on progress.   Increase zepbound  to 7.5 mg once weekly.   Update me in two months when you are ready to increase to the next dose.

## 2024-04-01 NOTE — Assessment & Plan Note (Signed)
"   Controlled with diet and exercise         "

## 2024-04-01 NOTE — Assessment & Plan Note (Signed)
 Reviewed hospital notes and x-ray.  Work note provided until Thursday. No sign of infection.  Will remove suture on Thursday.

## 2024-04-01 NOTE — Progress Notes (Signed)
 "  Established Patient Office Visit  Subjective   Patient ID: Julian Franklin, male    DOB: 1985/07/13  Age: 39 y.o. MRN: [id number]  Chief Complaint  Patient presents with   Annual Exam    HPI  Julian Franklin is 39 year old male with past medical history of HTN, asthma, OSA, GERD, obesity, prediabetes, hypertriglyceridemia, depression and vitamin d  deficiency presents today for complete physical and follow up of chronic conditions.  Immunizations: -Tetanus: Completed in 2026 -Influenza: due; declines -Pneumonia: due  Diet: Fair diet.  Exercise:  regular exercise.  Eye exam: Completed several years ago.  Dental exam: Completes semi-annually     Patient Active Problem List   Diagnosis Date Noted   Laceration of left hand without foreign body 04/01/2024   Vitamin D  deficiency 07/28/2023   Panic attack 06/30/2023   Gastroesophageal reflux disease 06/30/2023   Prediabetes 06/30/2023   Obstructive sleep apnea 06/30/2023   Hypertriglyceridemia 06/30/2023   Depression 06/30/2023   Eustachian tube dysfunction, bilateral 03/27/2023   Numbness and tingling of both upper extremities 03/27/2023   Mild intermittent asthma without complication 03/03/2023   Encounter for screening and preventative care 03/03/2023   Essential (primary) hypertension 03/03/2023   Family history of diabetes mellitus 03/03/2023   Class 3 severe obesity due to excess calories with body mass index (BMI) of 40.0 to 44.9 in adult [facility]) 03/03/2023   Past Medical History:  Diagnosis Date   Asthma    Hypertension    Past Surgical History:  Procedure Laterality Date   APPENDECTOMY     INNER EAR SURGERY     Allergies[1]       01/05/2024    3:48 PM 07/28/2023    3:54 PM 06/30/2023    3:34 PM  Depression screen PHQ 2/9  Decreased Interest 0 0 0  Down, Depressed, Hopeless 0 0 2  PHQ - 2 Score 0 0 2  Altered sleeping 0 0 1  Tired, decreased energy 0 2 1  Change in appetite 0 0 2  Feeling  bad or failure about yourself  0 0 2  Trouble concentrating 0 0 0  Moving slowly or fidgety/restless 0 0 0  Suicidal thoughts 0 0 0  PHQ-9 Score 0  2  8   Difficult doing work/chores Not difficult at all Somewhat difficult Somewhat difficult     Data saved with a previous flowsheet row definition       01/05/2024    3:48 PM 07/28/2023    3:55 PM 06/30/2023    3:35 PM 03/03/2023    2:57 PM  GAD 7 : Generalized Anxiety Score  Nervous, Anxious, on Edge 0 0 0 0  Control/stop worrying 0 0 0 0  Worry too much - different things 0 0 0 0  Trouble relaxing 0 0 0 0  Restless 0 0 0 0  Easily annoyed or irritable 0 0 1 0  Afraid - awful might happen 0 0 1 0  Total GAD 7 Score 0 0 2 0  Anxiety Difficulty Not difficult at all Not difficult at all Somewhat difficult Not difficult at all      Review of Systems  Constitutional:  Negative for chills and fever.  Respiratory:  Negative for shortness of breath.   Cardiovascular:  Negative for chest pain.  Gastrointestinal:  Negative for abdominal pain, constipation, diarrhea, heartburn, nausea and vomiting.  Genitourinary:  Negative for dysuria, frequency and urgency.  Neurological:  Negative for dizziness and headaches.  Endo/Heme/Allergies:  Negative for polydipsia.  Psychiatric/Behavioral:  Negative for depression and suicidal ideas. The patient is not nervous/anxious.       Objective:     BP 122/80   Pulse 90   Temp 97.9 F (36.6 C) (Temporal)   Ht 5' 6.5 (1.689 m)   Wt 259 lb 9.6 oz (117.8 kg)   SpO2 96%   BMI 41.27 kg/m  BP Readings from Last 3 Encounters:  04/01/24 122/80  03/28/24 (!) 151/100  01/05/24 124/80   Wt Readings from Last 3 Encounters:  04/01/24 259 lb 9.6 oz (117.8 kg)  03/28/24 257 lb 15 oz (117 kg)  01/05/24 259 lb (117.5 kg)      Physical Exam Vitals and nursing note reviewed.  Constitutional:      Appearance: Normal appearance.  HENT:     Head: Normocephalic and atraumatic.     Right Ear: Tympanic  membrane, ear canal and external ear normal.     Left Ear: Tympanic membrane, ear canal and external ear normal.     Nose: Nose normal.     Mouth/Throat:     Mouth: Mucous membranes are moist.     Pharynx: Oropharynx is clear.  Eyes:     Conjunctiva/sclera: Conjunctivae normal.     Pupils: Pupils are equal, round, and reactive to light.  Cardiovascular:     Rate and Rhythm: Normal rate and regular rhythm.     Pulses: Normal pulses.     Heart sounds: Normal heart sounds.  Pulmonary:     Effort: Pulmonary effort is normal.     Breath sounds: Normal breath sounds.  Abdominal:     General: Abdomen is flat. Bowel sounds are normal.     Palpations: Abdomen is soft.  Musculoskeletal:        General: Normal range of motion.       Arms:     Cervical back: Normal range of motion.     Comments: Laceration of left hand; covered with kerlix.  Skin:    General: Skin is warm and dry.     Capillary Refill: Capillary refill takes less than 2 seconds.  Neurological:     General: No focal deficit present.     Mental Status: He is alert and oriented to person, place, and time. Mental status is at baseline.  Psychiatric:        Mood and Affect: Mood normal.        Behavior: Behavior normal.        Thought Content: Thought content normal.        Judgment: Judgment normal.      No results found for any visits on 04/01/24.     The 10-year ASCVD risk score (Arnett DK, et al., 2019) is: 0.9%    Assessment & Plan:  Encounter for screening and preventative care Assessment & Plan: Immunizations tdap UTD; declines influenza and pneumonia vaccine.  Discussed the importance of a healthy diet and regular exercise in order for weight loss, and to reduce the risk of further co-morbidity.  Exam stable. Labs pending.  Follow up in 1 year for repeat physical.    Class 3 severe obesity due to excess calories with serious comorbidity and body mass index (BMI) of 40.0 to 44.9 in adult  [facility]) Assessment & Plan: Commended on progress.   Increase zepbound  to 7.5 mg once weekly.   Update me in two months when you are ready to increase to the next dose.  Orders: -  Zepbound ; Inject 7.5 mg into the skin once a week.  Dispense: 2 mL; Refill: 1  Mild episode of recurrent major depressive disorder Assessment & Plan: Controlled.   Continue Zoloft  100 mg once daily at bedtime.    Essential (primary) hypertension Assessment & Plan: Commended patient on progress.  BP at goal.    Gastroesophageal reflux disease, unspecified whether esophagitis present Assessment & Plan: Slightly worse.  Increase famotidine  20 mg twice daily.   Rx sent.  Orders: -     Famotidine ; Take 1 tablet (20 mg total) by mouth 2 (two) times daily.  Dispense: 90 tablet; Refill: 1  Hypertriglyceridemia Assessment & Plan: Controlled with diet and exercise.   Laceration of left hand without foreign body, subsequent encounter Assessment & Plan: Reviewed hospital notes and x-ray.  Work note provided until Thursday. No sign of infection.  Will remove suture on Thursday.       Return in about 6 months (around 09/29/2024) for chronic care management.SABRA Carrol Aurora, NP    [1]  Allergies Allergen Reactions   Ceclor [Cefaclor] Hives   "

## 2024-04-01 NOTE — Patient Instructions (Signed)
 Increase zepbound  to 7.5 mg once daily.   Increase pepcid  20 mg twice daily.   Follow up in 6 months.   It was a pleasure to see you today!

## 2024-04-02 ENCOUNTER — Encounter: Payer: Self-pay | Admitting: General Practice

## 2024-04-03 ENCOUNTER — Telehealth: Payer: Self-pay

## 2024-04-03 NOTE — Telephone Encounter (Signed)
 FMLA for Patient forms received for completion for patient. Patient has been informed that process may take up to 5 business days.  Employer Name [employer] Reason for being out: Hand issues Any inpatient care: Any Planned appointment? 1.8.26   Pt is asking for 1-2 x per month for up to hours if needed.   Verified with patient that it is ok to leave Voicemail updates on  Mobile [phone] (mobile)   Fax number form should be sent to is [phone]  Will reach out to patient when forms have been completed.  Forms placed in providers box for review.

## 2024-04-04 ENCOUNTER — Encounter: Payer: Self-pay | Admitting: General Practice

## 2024-04-04 ENCOUNTER — Ambulatory Visit: Admitting: General Practice

## 2024-04-04 VITALS — BP 150/96 | HR 85 | Temp 98.0°F | Ht 66.5 in | Wt 260.4 lb

## 2024-04-04 DIAGNOSIS — S61412D Laceration without foreign body of left hand, subsequent encounter: Secondary | ICD-10-CM | POA: Diagnosis not present

## 2024-04-04 DIAGNOSIS — Z4802 Encounter for removal of sutures: Secondary | ICD-10-CM

## 2024-04-04 NOTE — Telephone Encounter (Signed)
 Form filled out with patient and given back to [place].   No charge for these forms.

## 2024-04-04 NOTE — Progress Notes (Addendum)
 "  Established Patient Office Visit  Subjective   Patient ID: Julian Franklin, male    DOB: 1985-05-05  Age: 39 y.o. MRN: [id number]  Chief Complaint  Patient presents with   Suture / Staple Removal    On left hand; patient has 1 stitch that needs removal.     HPI  Julian Franklin is a 39 year old male with history of HTN, prediabetes, OSA, GERD, obesity presents today for an acute visit.   Discussed the use of AI scribe software for clinical note transcription with the patient, who gave verbal consent to proceed.  History of Present Illness Julian Franklin is a 39 year old male who presents for evaluation of a healing wound on his left hand.  He has a healing wound on his left hand from a laceration that required one stitch, which has been removed. No significant pain is reported, but tenderness is noted when pressure is applied.  He works in a physically demanding job social research officer, government, which involves lifting heavy objects. He is concerned about returning to work due to the tenderness of the wound and is considering light duty work to avoid lifting heavy objects until the tenderness subsides.   He denies any significant pain, swelling, or drainage from the wound.    Patient Active Problem List   Diagnosis Date Noted   Visit for suture removal 04/05/2024   Laceration of left hand without foreign body 04/01/2024   Vitamin D  deficiency 07/28/2023   Panic attack 06/30/2023   Gastroesophageal reflux disease 06/30/2023   Prediabetes 06/30/2023   Obstructive sleep apnea 06/30/2023   Hypertriglyceridemia 06/30/2023   Depression 06/30/2023   Eustachian tube dysfunction, bilateral 03/27/2023   Numbness and tingling of both upper extremities 03/27/2023   Mild intermittent asthma without complication 03/03/2023   Encounter for screening and preventative care 03/03/2023   Essential (primary) hypertension 03/03/2023   Family history of diabetes mellitus 03/03/2023    Class 3 severe obesity due to excess calories with body mass index (BMI) of 40.0 to 44.9 in adult [facility]) 03/03/2023   Past Medical History:  Diagnosis Date   Asthma    Hypertension    Past Surgical History:  Procedure Laterality Date   APPENDECTOMY     INNER EAR SURGERY     Allergies[1]       04/04/2024   11:12 AM 01/05/2024    3:48 PM 07/28/2023    3:54 PM  Depression screen PHQ 2/9  Decreased Interest 0 0 0  Down, Depressed, Hopeless 0 0 0  PHQ - 2 Score 0 0 0  Altered sleeping 0 0 0  Tired, decreased energy 0 0 2  Change in appetite 0 0 0  Feeling bad or failure about yourself  0 0 0  Trouble concentrating 0 0 0  Moving slowly or fidgety/restless 0 0 0  Suicidal thoughts 0 0 0  PHQ-9 Score 0 0  2   Difficult doing work/chores Not difficult at all Not difficult at all Somewhat difficult     Data saved with a previous flowsheet row definition       04/04/2024   11:12 AM 01/05/2024    3:48 PM 07/28/2023    3:55 PM 06/30/2023    3:35 PM  GAD 7 : Generalized Anxiety Score  Nervous, Anxious, on Edge 0 0 0 0  Control/stop worrying 0 0 0 0  Worry too much - different things 0 0 0 0  Trouble relaxing 0 0  0 0  Restless 0 0 0 0  Easily annoyed or irritable 0 0 0 1  Afraid - awful might happen 0 0 0 1  Total GAD 7 Score 0 0 0 2  Anxiety Difficulty Not difficult at all Not difficult at all Not difficult at all Somewhat difficult      Review of Systems  Constitutional:  Negative for chills and fever.  Respiratory:  Negative for shortness of breath.   Cardiovascular:  Negative for chest pain.  Gastrointestinal:  Negative for abdominal pain, constipation, diarrhea, heartburn, nausea and vomiting.  Genitourinary:  Negative for dysuria, frequency and urgency.  Neurological:  Negative for dizziness and headaches.  Endo/Heme/Allergies:  Negative for polydipsia.  Psychiatric/Behavioral:  Negative for depression and suicidal ideas. The patient is not nervous/anxious.        Objective:     BP (!) 150/96   Pulse 85   Temp 98 F (36.7 C) (Temporal)   Ht 5' 6.5 (1.689 m)   Wt 260 lb 6.4 oz (118.1 kg)   SpO2 97%   BMI 41.40 kg/m  BP Readings from Last 3 Encounters:  04/04/24 (!) 150/96  04/01/24 122/80  03/28/24 (!) 151/100   Wt Readings from Last 3 Encounters:  04/04/24 260 lb 6.4 oz (118.1 kg)  04/01/24 259 lb 9.6 oz (117.8 kg)  03/28/24 257 lb 15 oz (117 kg)      Physical Exam Vitals and nursing note reviewed.  Constitutional:      Appearance: Normal appearance.  Cardiovascular:     Rate and Rhythm: Normal rate and regular rhythm.     Pulses: Normal pulses.     Heart sounds: Normal heart sounds.  Pulmonary:     Effort: Pulmonary effort is normal.     Breath sounds: Normal breath sounds.  Neurological:     Mental Status: He is alert and oriented to person, place, and time.  Psychiatric:        Mood and Affect: Mood normal.        Behavior: Behavior normal.        Thought Content: Thought content normal.        Judgment: Judgment normal.      No results found for any visits on 04/04/24.     The 10-year ASCVD risk score (Arnett DK, et al., 2019) is: 1.3%    Assessment & Plan:  Laceration of left hand without foreign body, subsequent encounter  Visit for suture removal Assessment & Plan: Patient presents for suture removal. Patient consent obtained. The wound is well healed without signs of infection.  The suture are removed. Wound care and activity instructions given.  Given the tenderness present on ROM; limit duty advised.  F/u in two weeks.     Assessment and Plan Assessment & Plan Laceration of left hand Laceration healed, no infection, tenderness present. Fit for work with restrictions. - Advised to keep it covered while working. - Allowed return to work with restrictions: avoid lifting over 5-10 pounds for two weeks. - Recommended light duty work for two weeks. - Advised ice and ibuprofen  for pain management if  needed. - will allow patient to have 1-2 days off for flare up as needed until completely healed.  - Instructed to monitor for infection signs: redness, fever, chills, nausea, vomiting, pus. - Scheduled follow-up in two weeks for reassessment and potential full release.   Return in about 2 weeks (around 04/18/2024) for hand f/u.    Carrol Aurora, NP     [  1]  Allergies Allergen Reactions   Ceclor [Cefaclor] Hives   "

## 2024-04-04 NOTE — Telephone Encounter (Signed)
 Completed FMLA forms and return to work form received.  Copy sent to scan  Copy for patient is at the front desk to pick up at their convenience  Patient notified via phone call and will pick up at our office.

## 2024-04-05 DIAGNOSIS — Z4802 Encounter for removal of sutures: Secondary | ICD-10-CM | POA: Insufficient documentation

## 2024-04-05 NOTE — Addendum Note (Signed)
 Addended by: VINCENTE SHIVERS on: 04/05/2024 08:22 AM   Modules accepted: Level of Service

## 2024-04-05 NOTE — Assessment & Plan Note (Addendum)
 Patient presents for suture removal. Patient consent obtained. The wound is well healed without signs of infection.  The suture are removed. Wound care and activity instructions given.  Given the tenderness present on ROM; limit duty advised.  F/u in two weeks.

## 2024-04-19 ENCOUNTER — Ambulatory Visit: Admitting: General Practice

## 2024-04-24 ENCOUNTER — Ambulatory Visit: Admitting: Family Medicine

## 2024-10-04 ENCOUNTER — Ambulatory Visit: Admitting: General Practice
